# Patient Record
Sex: Female | Born: 1935 | Race: White | Hispanic: No | Smoking: Former smoker
Health system: Southern US, Community
[De-identification: ages and names within clinical notes are randomized; demographics above are authoritative.]

## PROBLEM LIST (undated history)

## (undated) DIAGNOSIS — E7800 Pure hypercholesterolemia, unspecified: Secondary | ICD-10-CM

## (undated) DIAGNOSIS — J449 Chronic obstructive pulmonary disease, unspecified: Secondary | ICD-10-CM

## (undated) DIAGNOSIS — K5790 Diverticulosis of intestine, part unspecified, without perforation or abscess without bleeding: Secondary | ICD-10-CM

## (undated) DIAGNOSIS — I10 Essential (primary) hypertension: Secondary | ICD-10-CM

## (undated) DIAGNOSIS — R739 Hyperglycemia, unspecified: Secondary | ICD-10-CM

## (undated) HISTORY — PX: TONSILLECTOMY: SUR1361

## (undated) HISTORY — DX: Diverticulosis of intestine, part unspecified, without perforation or abscess without bleeding: K57.90

## (undated) HISTORY — DX: Hyperglycemia, unspecified: R73.9

## (undated) HISTORY — DX: Essential (primary) hypertension: I10

## (undated) HISTORY — DX: Chronic obstructive pulmonary disease, unspecified: J44.9

## (undated) HISTORY — DX: Pure hypercholesterolemia, unspecified: E78.00

---

## 1976-03-30 HISTORY — PX: CHOLECYSTECTOMY: SHX55

## 1986-03-30 HISTORY — PX: WRIST SURGERY: SHX841

## 2004-08-13 ENCOUNTER — Ambulatory Visit: Payer: Self-pay | Admitting: Ophthalmology

## 2004-08-19 ENCOUNTER — Ambulatory Visit: Payer: Self-pay | Admitting: Ophthalmology

## 2004-12-17 ENCOUNTER — Ambulatory Visit: Payer: Self-pay | Admitting: Ophthalmology

## 2004-12-23 ENCOUNTER — Ambulatory Visit: Payer: Self-pay | Admitting: Ophthalmology

## 2005-09-16 ENCOUNTER — Ambulatory Visit: Payer: Self-pay | Admitting: Internal Medicine

## 2008-03-08 ENCOUNTER — Ambulatory Visit: Payer: Self-pay | Admitting: Internal Medicine

## 2008-04-26 ENCOUNTER — Ambulatory Visit: Payer: Self-pay | Admitting: Internal Medicine

## 2008-08-23 ENCOUNTER — Ambulatory Visit: Payer: Self-pay | Admitting: Internal Medicine

## 2011-03-21 ENCOUNTER — Inpatient Hospital Stay: Payer: Self-pay | Admitting: Internal Medicine

## 2011-03-30 ENCOUNTER — Encounter: Payer: Self-pay | Admitting: Internal Medicine

## 2011-03-31 ENCOUNTER — Encounter: Payer: Self-pay | Admitting: Internal Medicine

## 2011-05-01 ENCOUNTER — Encounter: Payer: Self-pay | Admitting: Internal Medicine

## 2011-05-29 ENCOUNTER — Encounter: Payer: Self-pay | Admitting: Internal Medicine

## 2012-02-18 ENCOUNTER — Encounter: Payer: Self-pay | Admitting: Internal Medicine

## 2012-02-18 ENCOUNTER — Ambulatory Visit (INDEPENDENT_AMBULATORY_CARE_PROVIDER_SITE_OTHER): Payer: Medicare Other | Admitting: Internal Medicine

## 2012-02-18 VITALS — BP 123/83 | HR 79 | Temp 97.7°F | Wt 124.8 lb

## 2012-02-18 DIAGNOSIS — R5383 Other fatigue: Secondary | ICD-10-CM

## 2012-02-18 DIAGNOSIS — J449 Chronic obstructive pulmonary disease, unspecified: Secondary | ICD-10-CM

## 2012-02-18 DIAGNOSIS — Z23 Encounter for immunization: Secondary | ICD-10-CM

## 2012-02-18 DIAGNOSIS — R739 Hyperglycemia, unspecified: Secondary | ICD-10-CM

## 2012-02-18 DIAGNOSIS — R5381 Other malaise: Secondary | ICD-10-CM

## 2012-02-18 DIAGNOSIS — E7800 Pure hypercholesterolemia, unspecified: Secondary | ICD-10-CM | POA: Insufficient documentation

## 2012-02-18 DIAGNOSIS — I10 Essential (primary) hypertension: Secondary | ICD-10-CM

## 2012-02-18 DIAGNOSIS — R7309 Other abnormal glucose: Secondary | ICD-10-CM

## 2012-02-18 LAB — POCT URINALYSIS DIPSTICK
Blood, UA: NEGATIVE
Nitrite, UA: NEGATIVE
Protein, UA: NEGATIVE
Urobilinogen, UA: 0.2
pH, UA: 5.5

## 2012-02-18 NOTE — Patient Instructions (Addendum)
It was nice to see you today.  I am glad you have been doing well.  Let me know if you need anything.

## 2012-02-19 LAB — CBC WITH DIFFERENTIAL/PLATELET
Basophils Absolute: 0.1 10*3/uL (ref 0.0–0.1)
Eosinophils Absolute: 0.1 10*3/uL (ref 0.0–0.7)
Lymphocytes Relative: 26 % (ref 12.0–46.0)
MCHC: 33.2 g/dL (ref 30.0–36.0)
Neutrophils Relative %: 63.6 % (ref 43.0–77.0)
RDW: 13.7 % (ref 11.5–14.6)

## 2012-02-22 ENCOUNTER — Other Ambulatory Visit (INDEPENDENT_AMBULATORY_CARE_PROVIDER_SITE_OTHER): Payer: Medicare Other | Admitting: *Deleted

## 2012-02-22 ENCOUNTER — Other Ambulatory Visit: Payer: Self-pay | Admitting: *Deleted

## 2012-02-22 DIAGNOSIS — R5383 Other fatigue: Secondary | ICD-10-CM

## 2012-02-22 DIAGNOSIS — R5381 Other malaise: Secondary | ICD-10-CM

## 2012-02-22 LAB — COMPREHENSIVE METABOLIC PANEL
ALT: 16 U/L (ref 0–35)
Alkaline Phosphatase: 84 U/L (ref 39–117)
Creatinine, Ser: 1.2 mg/dL (ref 0.4–1.2)
Glucose, Bld: 112 mg/dL — ABNORMAL HIGH (ref 70–99)
Sodium: 137 mEq/L (ref 135–145)
Total Bilirubin: 0.7 mg/dL (ref 0.3–1.2)
Total Protein: 7 g/dL (ref 6.0–8.3)

## 2012-02-24 NOTE — Progress Notes (Signed)
Called patient's daughter to let her know her mother's lab results

## 2012-02-24 NOTE — Progress Notes (Deleted)
Called patient's daughter to let her know reresultsre

## 2012-03-06 ENCOUNTER — Encounter: Payer: Self-pay | Admitting: Internal Medicine

## 2012-03-06 NOTE — Assessment & Plan Note (Signed)
Blood pressure is under good control.  Same medications.  Check metabolic panel.

## 2012-03-06 NOTE — Progress Notes (Signed)
  Subjective:    Patient ID: Christine Bowen, female    DOB: 12-04-1922, 78 y.o.   MRN: [id number]  HPI 78 year old female with past histroy of hypertension, emphysema/asthma and hypercholesterolemia who comes in today to follow up on these issues.  She is accompanied by her daughter.  History obtained from both of them.  She states she is doing well.  Saw Dr Meredeth Ide.  Breathing stable.  Continues oxygen.  Eating and drinking well.  No abdominal pain.  No nausea or vomiting.  Overall she feels things are stable.  Resides at [facility].    Past Medical History  Diagnosis Date  . COPD (chronic obstructive pulmonary disease)   . Hypertension   . Hypercholesterolemia   . Diverticulosis   . Hyperglycemia     Review of Systems Patient denies any headache, lightheadedness or dizziness.  No significant sinus or allergy symptoms.  No chest pain, tightness or palpitations.  No increased shortness of breath, cough or congestion.  She feels her breathing is stable.  No nausea or vomiting.  No abdominal pain or cramping.  No bowel change, such as diarrhea, constipation, BRBPR or melana.  No urine change.        Objective:   Physical Exam Filed Vitals:   02/18/12 1515  BP: 123/83  Pulse: 79  Temp: 97.7 F (71.2 C)   78 year old female in no acute distress.   HEENT:  Nares - clear.  OP- without lesions or erythema.  NECK:  Supple, nontender.   HEART:  Appears to be regular. LUNGS:  Without crackles or wheezing audible.  Respirations even and unlabored.   RADIAL PULSE:  Equal bilaterally.  ABDOMEN:  Soft, nontender.  No audible abdominal bruit.   EXTREMITIES:  No increased edema to be present.                   Assessment & Plan:  PREVIOUS WEIGHT LOSS.  Weight is stable.  No nausea or vomiting.  No abdominal pain.  Declines further GI testing or screening.  FATIGUE.  Will check cbc, met c and tsh.     HEALTH MAINTENANCE.  Declines mammograms and further colon evaluation or any further  testing. Declines bone density.

## 2012-03-06 NOTE — Assessment & Plan Note (Signed)
Low cholesterol diet.  Check lipid panel with next fasting labs.  

## 2012-03-06 NOTE — Assessment & Plan Note (Signed)
Low carb diet and exercise.  Follow met b and a1c.  

## 2012-03-06 NOTE — Assessment & Plan Note (Signed)
On oxygen.  Sees Dr Meredeth Ide.  Breathing stable.

## 2012-03-10 ENCOUNTER — Telehealth: Payer: Self-pay | Admitting: Internal Medicine

## 2012-03-10 NOTE — Telephone Encounter (Signed)
Patient Information:  Caller Name: Grasiela Jonsson  Phone: [phone]  Patient: Christine Bowen, Christine Bowen  Gender: Female  DOB: 1922/06/03  Age: 78 Years  PCP: Dale [place]  Office Follow Up:  Does the office need to follow up with this patient?: Yes  Instructions For The Office: Daughter would like to bring by lab specimen  RN Note:  Daughter would like to bring u/a specimen to the office. Her mother is in assisted living. Please contact daughter Cell [phone] or home  [phone]  Symptoms  Reason For Call & Symptoms: Daughter states her mother is at [facility]. She noticed yesterday while visiting her she was having Urinary Frequency and an odor to her Urine. Onset yesterday.  Her mother did complain of being tired/weak.  She states her mother is oxygen .  Daughter would like to bring a Urine Sample by the office., instead of getting her out again today. Please contact.  Reviewed Health History In EMR: Yes  Reviewed Medications In EMR: Yes  Reviewed Allergies In EMR: Yes  Reviewed Surgeries / Procedures: No  Date of Onset of Symptoms: 03/10/2012  Guideline(s) Used:  Urination Pain - Female  Disposition Per Guideline:   See Today in Office  Reason For Disposition Reached:   Age > 50 years  Advice Given:  Fluids:   Drink extra fluids. Drink 8-10 glasses of liquids a day (Reason: to produce a dilute, non-irritating urine).  Cranberry Juice:   Some people think that drinking cranberry juice may help in fighting urinary tract infections. However, there is no good research that has ever proved this.  Dosage Cranberry Juice Cocktail: 8 oz (240 ml) twice a day.  Patient Refused Recommendation:  Patient Refused Care Advice  Daughter would like to bring lab specimen by first  Medication allergies and history reviewed in EMR

## 2012-03-10 NOTE — Telephone Encounter (Signed)
Would it be all right to bring urine by office. Please advise

## 2012-03-10 NOTE — Telephone Encounter (Signed)
Daughter returned call, she is going to stop by and pick-up urine cup from office tomorrow.

## 2012-03-10 NOTE — Telephone Encounter (Signed)
Ok for daughter to bring urine by.

## 2012-03-11 ENCOUNTER — Telehealth: Payer: Self-pay | Admitting: *Deleted

## 2012-03-11 ENCOUNTER — Other Ambulatory Visit (INDEPENDENT_AMBULATORY_CARE_PROVIDER_SITE_OTHER): Payer: Medicare Other

## 2012-03-11 DIAGNOSIS — N390 Urinary tract infection, site not specified: Secondary | ICD-10-CM

## 2012-03-11 LAB — POCT URINALYSIS DIPSTICK
Bilirubin, UA: NEGATIVE
Glucose, UA: NEGATIVE
Ketones, UA: NEGATIVE
Leukocytes, UA: NEGATIVE
pH, UA: 5.5

## 2012-03-11 NOTE — Telephone Encounter (Signed)
Urine culture sent.  Hold on abx

## 2012-03-11 NOTE — Telephone Encounter (Signed)
Do you want a urine culture for this pt? Thank you

## 2012-03-11 NOTE — Telephone Encounter (Signed)
Called patient with results. Will do further testing on urine.

## 2012-03-11 NOTE — Addendum Note (Signed)
Addended by: Montine Circle D on: 03/11/2012 05:02 PM   Modules accepted: Orders

## 2012-03-13 LAB — URINE CULTURE

## 2012-05-20 ENCOUNTER — Telehealth: Payer: Self-pay | Admitting: Internal Medicine

## 2012-05-20 NOTE — Telephone Encounter (Signed)
If pt has been vomiting for 6 days - needs eval today.  Probably needs eval in er if 6 days - concern regarding her age and dehydration

## 2012-05-20 NOTE — Telephone Encounter (Signed)
Patient Information:  Caller Name: Bonnie/Daughter  Phone: [phone]  Patient: Christine Bowen, Christine Bowen  Gender: Female  DOB: [birth year]  Age: 79 Years  PCP: Dale [facility]  Office Follow Up:  Does the office need to follow up with this patient?: Yes  Instructions For The Office: Assisted Living Patient. No appt available . Vomiting x6 days. TRIAGES TO BE SEEN TODAY   Symptoms  Reason For Call & Symptoms: Christine Bowen Daughter is calling concerning her mother Christine Bowen.  She is a resident at [facility] living.  She got the "GI bug" onset  05/15/12. Vomiting has contined for x6 days.  Daughter believes 1-2 x daily.  > vomiting greater 6 days.  Daughter has not seen her in a few days only talked to her.  Unable to complete a detailed triage. Daughter is going today to check on her.    Reviewed Health History In EMR: Yes  Reviewed Medications In EMR: Yes  Reviewed Allergies In EMR: Yes  Reviewed Surgeries / Procedures: No  Date of Onset of Symptoms: 05/15/2012  Treatments Tried: oral fluids  Treatments Tried Worked: No  Guideline(s) Used:  Vomiting  Disposition Per Guideline:   See Today in Office  Reason For Disposition Reached:   Vomiting lasts > 48 hours  Advice Given:  Clear Liquids:  Sip water or a rehydration drink (e.g., Gatorade or Powerade).  Other options: 1/2 strength flat lemon-lime soda or ginger ale.  After 4 hours without vomiting, increase the amount.  Reassurance:  Vomiting can be caused by many types of illnesses. It can be caused by a stomach flu virus. It can be caused by eating or drinking something that disagreed with your stomach.  Adults with vomiting need to stay hydrated. This is the most important thing. If you don't drink and replace lost fluids, you may get dehydrated.  Call Back If:  Vomiting lasts for more than 2 days (48 hours)  Signs of dehydration occur  You become worse.

## 2012-05-20 NOTE — Telephone Encounter (Signed)
Called patient's daughter and advised daughter to take patient to acute care for eval. Patient's daughter stated that patient is reluctant to go but that she would try to persuade her to go. Advised daughter to follow with [place] after.

## 2012-06-21 ENCOUNTER — Emergency Department: Payer: Self-pay | Admitting: Emergency Medicine

## 2012-06-23 ENCOUNTER — Telehealth: Payer: Self-pay | Admitting: Internal Medicine

## 2012-06-23 NOTE — Telephone Encounter (Signed)
Please advise 

## 2012-06-23 NOTE — Telephone Encounter (Signed)
Ok for home health

## 2012-06-23 NOTE — Telephone Encounter (Signed)
States when changing dressing for laceration on pt lower left leg the wrap is pulling away the skin.  Asking for order for home health to come in and do the dressing changes.

## 2012-06-24 NOTE — Telephone Encounter (Signed)
Patient is staying at [facility]. #[phone] fax [phone]. Patient needing something done today . Daughter  Doylene Canard came by the office

## 2012-06-24 NOTE — Telephone Encounter (Signed)
See other message

## 2012-06-24 NOTE — Telephone Encounter (Signed)
Amber which home health is this patient with? Thanks.

## 2012-06-26 ENCOUNTER — Emergency Department: Payer: Self-pay | Admitting: Internal Medicine

## 2012-06-26 LAB — COMPREHENSIVE METABOLIC PANEL
Albumin: 3.3 g/dL — ABNORMAL LOW (ref 3.4–5.0)
Alkaline Phosphatase: 102 U/L (ref 50–136)
BUN: 20 mg/dL — ABNORMAL HIGH (ref 7–18)
Bilirubin,Total: 0.8 mg/dL (ref 0.2–1.0)
Chloride: 97 mmol/L — ABNORMAL LOW (ref 98–107)
Glucose: 115 mg/dL — ABNORMAL HIGH (ref 65–99)
Potassium: 3 mmol/L — ABNORMAL LOW (ref 3.5–5.1)
SGOT(AST): 25 U/L (ref 15–37)
SGPT (ALT): 18 U/L (ref 12–78)

## 2012-06-26 LAB — CBC
HGB: 13.9 g/dL (ref 12.0–16.0)
MCH: 30.1 pg (ref 26.0–34.0)
MCHC: 35 g/dL (ref 32.0–36.0)
MCV: 86 fL (ref 80–100)
RBC: 4.61 10*6/uL (ref 3.80–5.20)
WBC: 7 10*3/uL (ref 3.6–11.0)

## 2012-06-27 ENCOUNTER — Telehealth: Payer: Self-pay | Admitting: *Deleted

## 2012-06-27 NOTE — Telephone Encounter (Signed)
Called Kendal Hymen back to make appointment for patient to see Raquel Rey. Kendal Hymen said that she does not want to bring mother back out today. She would rather wait until Friday and come in to get you to remove stitches from patient's leg. ER physician advised patient to see you to remove her stitches. Kendal Hymen said that she wants to come in the afternoon because patient takes oxygen treatments in the morning.

## 2012-06-29 ENCOUNTER — Telehealth: Payer: Self-pay | Admitting: Internal Medicine

## 2012-06-29 NOTE — Telephone Encounter (Signed)
Patient's daughter calling to see when she can bring her mother in to see Dr. Lorin Picket for her ER visit and to have her stiches removed.

## 2012-06-29 NOTE — Telephone Encounter (Signed)
[facility] did receive an order for a nurse to perform physical and occupational therapy for this patient ,but there will be a delay in the start up of her care because of staff shortage. They should be able to get in over the weekend. Christine Bowen will notify [employer] .

## 2012-06-30 NOTE — Telephone Encounter (Signed)
Have her come in tomorrow at 3:30.

## 2012-06-30 NOTE — Telephone Encounter (Signed)
I was under the impression that someone had already assessed her.  I need to know how her let looks.  Have they changed dressing at all

## 2012-06-30 NOTE — Telephone Encounter (Signed)
Called patient's daughter back with appointment time.

## 2012-06-30 NOTE — Telephone Encounter (Signed)
Have her come in tomorrow at 3:30 (block 30 min).

## 2012-06-30 NOTE — Telephone Encounter (Signed)
The dressing is being changed. The wound looks much better, still wide open wound with partial stiches. The wound bleed a little when dressing was changed yesterday. Over all the wound looks much better and has signs of healing.

## 2012-06-30 NOTE — Telephone Encounter (Signed)
Please advise 

## 2012-06-30 NOTE — Telephone Encounter (Signed)
See other message

## 2012-07-01 ENCOUNTER — Ambulatory Visit (INDEPENDENT_AMBULATORY_CARE_PROVIDER_SITE_OTHER): Payer: Medicare Other | Admitting: Internal Medicine

## 2012-07-01 ENCOUNTER — Encounter: Payer: Self-pay | Admitting: Internal Medicine

## 2012-07-01 VITALS — BP 102/60 | HR 93 | Temp 97.5°F | Ht 60.0 in | Wt 121.5 lb

## 2012-07-01 DIAGNOSIS — I10 Essential (primary) hypertension: Secondary | ICD-10-CM

## 2012-07-01 DIAGNOSIS — J449 Chronic obstructive pulmonary disease, unspecified: Secondary | ICD-10-CM

## 2012-07-03 ENCOUNTER — Encounter: Payer: Self-pay | Admitting: Internal Medicine

## 2012-07-03 NOTE — Assessment & Plan Note (Signed)
Blood pressure is under good control.  Same medications.

## 2012-07-03 NOTE — Assessment & Plan Note (Signed)
On oxygen.  Sees Dr Meredeth Ide.  With increased cough and congestion.  Better.  Antibiotics due to end today.  Will extend the avelox one more week.  Use the inhalers.   Follow.
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
## 2012-07-04 ENCOUNTER — Telehealth: Payer: Self-pay | Admitting: *Deleted

## 2012-07-04 NOTE — Telephone Encounter (Signed)
I called home health about patient's dressing changes. The nurse said that they have not changed dressing today but she was going over there in the morning to change dressing, I asked nurse to call us tomorrow to let us know how patient's wound looks. Nurses name is Britt Boozer and her number is [phone].

## 2012-07-04 NOTE — Telephone Encounter (Signed)
Noted.  They also need to let us know if she needs to come over here to get sutures removed.

## 2012-07-05 NOTE — Telephone Encounter (Signed)
Christine Bowen please schedule this patient for 12:00 pm 07/05/12. Thanks.

## 2012-07-05 NOTE — Telephone Encounter (Signed)
Called home health back and they would not give me any information about patient's dressing change or how the wound looks. Nurse at home health was talking to patient's daughter at the home and they want to go ahead and bring patient in at 12:00 tomorrow.

## 2012-07-05 NOTE — Telephone Encounter (Signed)
Patient's daughter called back to let us know that she does not need to come in tomorrow. Patient's stitches have been remove and home health said wound looks good. They will continue to change dressing two times a weekly.

## 2012-07-05 NOTE — Telephone Encounter (Signed)
noted 

## 2012-07-20 DIAGNOSIS — S81809A Unspecified open wound, unspecified lower leg, initial encounter: Secondary | ICD-10-CM

## 2012-07-20 DIAGNOSIS — J069 Acute upper respiratory infection, unspecified: Secondary | ICD-10-CM

## 2012-07-20 DIAGNOSIS — S91009A Unspecified open wound, unspecified ankle, initial encounter: Secondary | ICD-10-CM

## 2012-07-20 DIAGNOSIS — S81009A Unspecified open wound, unspecified knee, initial encounter: Secondary | ICD-10-CM

## 2012-07-20 DIAGNOSIS — R279 Unspecified lack of coordination: Secondary | ICD-10-CM

## 2012-07-28 ENCOUNTER — Telehealth: Payer: Self-pay

## 2012-07-28 NOTE — Telephone Encounter (Signed)
Noted.  Just have them let us know if they need anything from [place].

## 2012-07-28 NOTE — Telephone Encounter (Signed)
Jasmine December from [facility] left message on voicemail about patient Christine Bowen, regarding her wound. Jasmine December stated that the wound is looking good and she is going to check on the patient next Monday to see how the wound is looking. Jasmine December stated that if the wound looked good she would discharge the patient.

## 2012-08-17 ENCOUNTER — Encounter: Payer: Self-pay | Admitting: Internal Medicine

## 2012-08-17 ENCOUNTER — Ambulatory Visit (INDEPENDENT_AMBULATORY_CARE_PROVIDER_SITE_OTHER): Payer: Medicare Other | Admitting: Internal Medicine

## 2012-08-17 VITALS — BP 130/70 | HR 89 | Temp 98.6°F | Ht 60.0 in | Wt 127.5 lb

## 2012-08-17 DIAGNOSIS — E7800 Pure hypercholesterolemia, unspecified: Secondary | ICD-10-CM

## 2012-08-17 DIAGNOSIS — J4489 Other specified chronic obstructive pulmonary disease: Secondary | ICD-10-CM

## 2012-08-17 DIAGNOSIS — R5383 Other fatigue: Secondary | ICD-10-CM

## 2012-08-17 DIAGNOSIS — R5381 Other malaise: Secondary | ICD-10-CM

## 2012-08-17 DIAGNOSIS — R739 Hyperglycemia, unspecified: Secondary | ICD-10-CM

## 2012-08-17 DIAGNOSIS — R7309 Other abnormal glucose: Secondary | ICD-10-CM

## 2012-08-17 DIAGNOSIS — I10 Essential (primary) hypertension: Secondary | ICD-10-CM

## 2012-08-17 DIAGNOSIS — J449 Chronic obstructive pulmonary disease, unspecified: Secondary | ICD-10-CM

## 2012-08-18 ENCOUNTER — Ambulatory Visit: Payer: Medicare Other | Admitting: Internal Medicine

## 2012-08-18 LAB — COMPREHENSIVE METABOLIC PANEL
ALT: 15 U/L (ref 0–35)
AST: 23 U/L (ref 0–37)
Alkaline Phosphatase: 75 U/L (ref 39–117)
BUN: 17 mg/dL (ref 6–23)
Calcium: 9.4 mg/dL (ref 8.4–10.5)
Chloride: 101 mEq/L (ref 96–112)
Creatinine, Ser: 1.1 mg/dL (ref 0.4–1.2)
Total Bilirubin: 0.6 mg/dL (ref 0.3–1.2)

## 2012-08-18 LAB — CBC WITH DIFFERENTIAL/PLATELET
Basophils Absolute: 0 10*3/uL (ref 0.0–0.1)
Basophils Relative: 0.4 % (ref 0.0–3.0)
Eosinophils Absolute: 0.1 10*3/uL (ref 0.0–0.7)
HCT: 41.1 % (ref 36.0–46.0)
Hemoglobin: 14 g/dL (ref 12.0–15.0)
Lymphs Abs: 2.6 10*3/uL (ref 0.7–4.0)
MCHC: 34.1 g/dL (ref 30.0–36.0)
MCV: 88 fl (ref 78.0–100.0)
Neutro Abs: 7.8 10*3/uL — ABNORMAL HIGH (ref 1.4–7.7)
RBC: 4.67 Mil/uL (ref 3.87–5.11)
RDW: 13.7 % (ref 11.5–14.6)

## 2012-08-18 LAB — TSH: TSH: 1.4 u[IU]/mL (ref 0.35–5.50)

## 2012-08-19 ENCOUNTER — Other Ambulatory Visit: Payer: Self-pay | Admitting: Internal Medicine

## 2012-08-19 DIAGNOSIS — D72829 Elevated white blood cell count, unspecified: Secondary | ICD-10-CM

## 2012-08-19 NOTE — Progress Notes (Signed)
F/u labs ordered.   

## 2012-08-21 ENCOUNTER — Encounter: Payer: Self-pay | Admitting: Internal Medicine

## 2012-08-21 NOTE — Progress Notes (Signed)
  Subjective:    Patient ID: Christine Bowen, female    DOB: 21-May-1922, 79 y.o.   MRN: [id number]  HPI 79 year old female with past histroy of hypertension, emphysema/asthma and hypercholesterolemia who comes in today for a scheduled follow up.   She is accompanied by her daughter.  History obtained from both of them.  Her cough and congestion have improved.  She feels her breathing is overall stable.  Using her inhalers.  Seeing Dr Meredeth Ide.  Due to see him next week.  Still gets winded with increased activity or exertion, but feels this is stable.  No chest pain or tightness.  No acid reflux.  Eating and drinking well.  No nausea or vomiting.  No bowel change.      Past Medical History  Diagnosis Date  . COPD (chronic obstructive pulmonary disease)   . Hypertension   . Hypercholesterolemia   . Diverticulosis   . Hyperglycemia     Current Outpatient Prescriptions on File Prior to Visit  Medication Sig Dispense Refill  . aspirin 325 MG tablet Take 325 mg by mouth daily.      . meclizine (ANTIVERT) 25 MG tablet Take 25 mg by mouth 2 (two) times daily as needed.      . sertraline (ZOLOFT) 25 MG tablet Take 25 mg by mouth daily.      . Skin Protectants, Misc. (MINERIN) CREA Apply topically daily.      Marland Kitchen tiotropium (SPIRIVA) 18 MCG inhalation capsule Place 18 mcg into inhaler and inhale daily.      Marland Kitchen zolpidem (AMBIEN) 5 MG tablet 5 mg. 1/2 tablet qhs prn       No current facility-administered medications on file prior to visit.    Review of Systems Patient denies any headache, lightheadedness or dizziness.  No significant sinus or allergy symptoms.  No chest pain, tightness or palpitations.  Increased cough and chest congestion.  She feels her breathing is stable.  No significant increased sob.  No nausea or vomiting.  No abdominal pain or cramping.  No bowel change, such as diarrhea, constipation, BRBPR or melana.  No urine change.   Leg laceration - better.  Healed.  Does not walk around a  lot.       Objective:   Physical Exam  Filed Vitals:   08/17/12 1443  BP: 130/70  Pulse: 89  Temp: 98.6 F (37 C)   Blood pressure recheck;  3/37  79 year old female in no acute distress.   HEENT:  Nares - clear.  OP- without lesions or erythema.  NECK:  Supple, nontender.   HEART:  Appears to be regular. LUNGS:  Without crackles or wheezing audible.  Respirations even and unlabored.  Good breath sounds bilaterally.  Some increased cough with expiration.  RADIAL PULSE:  Equal bilaterally.  ABDOMEN:  Soft, nontender.     EXTREMITIES:  No increased edema to be present.  Stable.  Laceration healed.  No increased erythema.                   Assessment & Plan:  LEG LACERATION.  Healed.  Follow.    URI.  Better. Cough and congestion improved.  Breathing stable.  Follow.       HEALTH MAINTENANCE.  Declines mammograms and further colon evaluation or any further testing. Declines bone density.

## 2012-08-21 NOTE — Assessment & Plan Note (Signed)
On oxygen.  Sees Dr Meredeth Ide.  Cough and congestion better.  Feels her breathing (overall) is stable.  Follow.

## 2012-08-21 NOTE — Assessment & Plan Note (Signed)
Blood pressure is under good control.  Same medications.  Check metabolic panel.

## 2012-08-21 NOTE — Assessment & Plan Note (Signed)
Low cholesterol diet.  Check lipid panel with next fasting labs.  

## 2012-08-21 NOTE — Assessment & Plan Note (Signed)
Low carb diet and exercise.  Follow met b and a1c.  

## 2012-09-02 ENCOUNTER — Other Ambulatory Visit: Payer: Medicare Other

## 2012-09-06 ENCOUNTER — Other Ambulatory Visit (INDEPENDENT_AMBULATORY_CARE_PROVIDER_SITE_OTHER): Payer: Medicare Other

## 2012-09-06 ENCOUNTER — Encounter: Payer: Self-pay | Admitting: Internal Medicine

## 2012-09-06 DIAGNOSIS — D72829 Elevated white blood cell count, unspecified: Secondary | ICD-10-CM

## 2012-09-06 LAB — CBC WITH DIFFERENTIAL/PLATELET
Basophils Absolute: 0.1 10*3/uL (ref 0.0–0.1)
Basophils Relative: 0.5 % (ref 0.0–3.0)
Eosinophils Absolute: 0.1 10*3/uL (ref 0.0–0.7)
Lymphocytes Relative: 27.4 % (ref 12.0–46.0)
MCHC: 33.3 g/dL (ref 30.0–36.0)
MCV: 90.2 fl (ref 78.0–100.0)
Monocytes Absolute: 0.8 10*3/uL (ref 0.1–1.0)
Neutro Abs: 6 10*3/uL (ref 1.4–7.7)
Neutrophils Relative %: 62.5 % (ref 43.0–77.0)
RBC: 4.57 Mil/uL (ref 3.87–5.11)
RDW: 13.5 % (ref 11.5–14.6)

## 2012-10-24 ENCOUNTER — Ambulatory Visit: Payer: Self-pay | Admitting: Cardiology

## 2012-10-24 DIAGNOSIS — J4489 Other specified chronic obstructive pulmonary disease: Secondary | ICD-10-CM

## 2012-10-24 DIAGNOSIS — J449 Chronic obstructive pulmonary disease, unspecified: Secondary | ICD-10-CM

## 2012-10-24 LAB — CBC WITH DIFFERENTIAL/PLATELET
Basophil #: 0.1 10*3/uL (ref 0.0–0.1)
Basophil %: 1.1 %
HCT: 42.7 % (ref 35.0–47.0)
HGB: 14.6 g/dL (ref 12.0–16.0)
Lymphocyte %: 26 %
MCH: 29.3 pg (ref 26.0–34.0)
MCHC: 34.2 g/dL (ref 32.0–36.0)
MCV: 86 fL (ref 80–100)
Monocyte #: 0.8 x10 3/mm (ref 0.2–0.9)
Neutrophil %: 64 %
Platelet: 302 10*3/uL (ref 150–440)
RBC: 4.98 10*6/uL (ref 3.80–5.20)
RDW: 13.5 % (ref 11.5–14.5)

## 2012-10-24 LAB — APTT: Activated PTT: 31.5 secs (ref 23.6–35.9)

## 2012-10-24 LAB — BASIC METABOLIC PANEL
Anion Gap: 6 — ABNORMAL LOW (ref 7–16)
BUN: 20 mg/dL — ABNORMAL HIGH (ref 7–18)
Calcium, Total: 9.9 mg/dL (ref 8.5–10.1)
Chloride: 101 mmol/L (ref 98–107)
Co2: 32 mmol/L (ref 21–32)
Glucose: 96 mg/dL (ref 65–99)

## 2012-10-24 LAB — PROTIME-INR: INR: 1

## 2012-10-27 ENCOUNTER — Ambulatory Visit: Payer: Self-pay | Admitting: Cardiology

## 2012-10-27 ENCOUNTER — Encounter: Payer: Self-pay | Admitting: Cardiology

## 2013-01-13 ENCOUNTER — Other Ambulatory Visit: Payer: Self-pay | Admitting: *Deleted

## 2013-01-13 MED ORDER — ZOLPIDEM TARTRATE 5 MG PO TABS: ORAL_TABLET | ORAL | Status: DC

## 2013-01-13 NOTE — Telephone Encounter (Signed)
Refilled ambien #30 with one refill.   

## 2013-01-13 NOTE — Telephone Encounter (Signed)
Requesting a new Rx for Ambien (same directions) fax Rx to : [phone] & they can send it to there pharmacy

## 2013-01-27 ENCOUNTER — Telehealth: Payer: Self-pay | Admitting: *Deleted

## 2013-01-27 NOTE — Telephone Encounter (Signed)
Need permission to D/C a treatment & a medication- Minerin Cream & the order for the warm compress  Fax order to: [phone]

## 2013-01-29 NOTE — Telephone Encounter (Signed)
Ordered added to another order they had faxed.  In your box.  Ok for d/c if no longer needed.

## 2013-01-30 NOTE — Telephone Encounter (Signed)
Forms faxed

## 2013-02-17 ENCOUNTER — Telehealth: Payer: Self-pay | Admitting: Internal Medicine

## 2013-02-17 NOTE — Telephone Encounter (Signed)
I am ok to send order for urinalysis and culture, but are they able to do this over at [facility].  I was not aware they were able to do this.  If symptoms and unable to do, then I would rec her being evaluated at Acute Care.

## 2013-02-17 NOTE — Telephone Encounter (Signed)
Pt's daughter is calling and saying pt is coming down with uti and is needing order sent over to [facility] assisted living.Fax number is [phone]

## 2013-02-17 NOTE — Telephone Encounter (Signed)
Order written.  Gave to [facility] to fax.

## 2013-02-17 NOTE — Telephone Encounter (Signed)
I spoke with [facility], they said all they need is a order. The fax number below is correct

## 2013-02-20 ENCOUNTER — Ambulatory Visit (INDEPENDENT_AMBULATORY_CARE_PROVIDER_SITE_OTHER): Payer: Medicare Other | Admitting: Internal Medicine

## 2013-02-20 ENCOUNTER — Encounter: Payer: Self-pay | Admitting: Internal Medicine

## 2013-02-20 VITALS — BP 120/70 | HR 86 | Temp 98.7°F | Ht 61.0 in | Wt 127.5 lb

## 2013-02-20 DIAGNOSIS — Z1239 Encounter for other screening for malignant neoplasm of breast: Secondary | ICD-10-CM

## 2013-02-20 DIAGNOSIS — E7800 Pure hypercholesterolemia, unspecified: Secondary | ICD-10-CM

## 2013-02-20 DIAGNOSIS — R739 Hyperglycemia, unspecified: Secondary | ICD-10-CM

## 2013-02-20 DIAGNOSIS — I10 Essential (primary) hypertension: Secondary | ICD-10-CM

## 2013-02-20 DIAGNOSIS — I495 Sick sinus syndrome: Secondary | ICD-10-CM

## 2013-02-20 DIAGNOSIS — J449 Chronic obstructive pulmonary disease, unspecified: Secondary | ICD-10-CM

## 2013-02-20 DIAGNOSIS — R350 Frequency of micturition: Secondary | ICD-10-CM

## 2013-02-20 DIAGNOSIS — R7309 Other abnormal glucose: Secondary | ICD-10-CM

## 2013-02-20 NOTE — Progress Notes (Signed)
Subjective:    Patient ID: Christine Bowen, female    DOB: [birth year], 79 y.o.   MRN: [id number]  HPI 79 year old female with past histroy of hypertension, emphysema/asthma and hypercholesterolemia who comes in today for a scheduled follow up.   She is accompanied by her daughter.  History obtained from both of them.  She feels her breathing is overall stable.  Using her inhalers.  Seeing Dr Meredeth Ide.  No chest pain or tightness.  Has done well s/p pacemaker placement.  Had placed 10/26/12.  Still with soreness around the pacemaker site.  Sees Dr Lady Gary. No acid reflux.  Eating and drinking well.  No nausea or vomiting.  No bowel change.  Does report some increased urinary pressure.  Started several days ago.  Some increased frequency.  No hematuria.      Past Medical History  Diagnosis Date  . COPD (chronic obstructive pulmonary disease)   . Hypertension   . Hypercholesterolemia   . Diverticulosis   . Hyperglycemia     Current Outpatient Prescriptions on File Prior to Visit  Medication Sig Dispense Refill  . aspirin 325 MG tablet Take 325 mg by mouth daily.      . Fluticasone-Salmeterol (ADVAIR DISKUS) 250-50 MCG/DOSE AEPB Inhale 1 puff into the lungs every 12 (twelve) hours.      . hydrochlorothiazide (MICROZIDE) 12.5 MG capsule Take 12.5 mg by mouth daily.      . meclizine (ANTIVERT) 25 MG tablet Take 25 mg by mouth 2 (two) times daily as needed.      . sertraline (ZOLOFT) 25 MG tablet Take 25 mg by mouth daily.      . Skin Protectants, Misc. (MINERIN) CREA Apply topically daily.      Marland Kitchen tiotropium (SPIRIVA) 18 MCG inhalation capsule Place 18 mcg into inhaler and inhale daily.      Marland Kitchen tobramycin (TOBREX) 0.3 % ophthalmic ointment Place 1 application into the right eye daily.      Marland Kitchen zolpidem (AMBIEN) 5 MG tablet 1/2 tablet qhs prn  30 tablet  0   No current facility-administered medications on file prior to visit.    Review of Systems Patient denies any headache, lightheadedness or  dizziness.  No significant sinus or allergy symptoms.  No chest pain, tightness or palpitations.  No increased cough or chest congestion.  She feels her breathing is stable.  No significant increased sob.  No nausea or vomiting.  No abdominal pain or cramping.  No bowel change, such as diarrhea, constipation, BRBPR or melana.  Urine change as outlined.  Has done well since her pacemaker placement.  Still some soreness around the pacemaker site.        Objective:   Physical Exam  Filed Vitals:   02/20/13 1542  BP: 120/70  Pulse: 86  Temp: 98.7 F (37.1 C)   Blood pressure recheck;  136/74, pulse 47  79 year old female in no acute distress.   HEENT:  Nares - clear.  OP- without lesions or erythema.  NECK:  Supple, nontender.   HEART:  Appears to be regular. LUNGS:  Without crackles or wheezing audible.  Respirations even and unlabored.  Good breath sounds bilaterally. CHEST WALL:  Increased pain to palpation around the pacemaker site.   RADIAL PULSE:  Equal bilaterally.  ABDOMEN:  Soft, nontender.     EXTREMITIES:  No increased edema to be present.  Stable.  No increased erythema.  Stasis changes.  Stable.  Assessment & Plan:  HEALTH MAINTENANCE.  Had declined mammograms and further colon evaluation or any further testing.  Declines bone density.  Was agreeable today for mammogram.

## 2013-02-20 NOTE — Progress Notes (Signed)
Pre-visit discussion using our clinic review tool. No additional management support is needed unless otherwise documented below in the visit note.  

## 2013-02-21 LAB — URINALYSIS, ROUTINE W REFLEX MICROSCOPIC
Bilirubin Urine: NEGATIVE
Ketones, ur: NEGATIVE
RBC / HPF: NONE SEEN (ref 0–?)
Specific Gravity, Urine: 1.025 (ref 1.000–1.030)
Total Protein, Urine: NEGATIVE
Urine Glucose: NEGATIVE
pH: 6.5 (ref 5.0–8.0)

## 2013-02-21 LAB — COMPREHENSIVE METABOLIC PANEL
ALT: 16 U/L (ref 0–35)
AST: 21 U/L (ref 0–37)
BUN: 16 mg/dL (ref 6–23)
Calcium: 9.6 mg/dL (ref 8.4–10.5)
Creatinine, Ser: 1 mg/dL (ref 0.4–1.2)
Total Bilirubin: 0.6 mg/dL (ref 0.3–1.2)

## 2013-02-22 LAB — CULTURE, URINE COMPREHENSIVE

## 2013-02-24 ENCOUNTER — Other Ambulatory Visit: Payer: Self-pay | Admitting: *Deleted

## 2013-02-24 MED ORDER — CEFUROXIME AXETIL 250 MG PO TABS: 250.0000 mg | ORAL_TABLET | Freq: Two times a day (BID) | ORAL | Status: DC

## 2013-02-25 ENCOUNTER — Encounter: Payer: Self-pay | Admitting: Internal Medicine

## 2013-02-25 DIAGNOSIS — I495 Sick sinus syndrome: Secondary | ICD-10-CM | POA: Insufficient documentation

## 2013-02-25 DIAGNOSIS — R112 Nausea with vomiting, unspecified: Secondary | ICD-10-CM | POA: Insufficient documentation

## 2013-02-25 NOTE — Assessment & Plan Note (Signed)
Was previously having increased fatigue.  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  Holter also revealed pauses.  Is now s/p pacemaker placement.  Has done well.  Follow.  Continue to follow up with Dr Fath.       

## 2013-02-25 NOTE — Assessment & Plan Note (Signed)
Low carb diet and exercise.  Follow met b and a1c.  

## 2013-02-25 NOTE — Assessment & Plan Note (Signed)
Check urinalysis and culture.  Treat if infection present.  Follow.

## 2013-02-25 NOTE — Assessment & Plan Note (Signed)
Low cholesterol diet.  Check lipid panel with next fasting labs.  

## 2013-02-25 NOTE — Assessment & Plan Note (Signed)
On oxygen.  Sees Dr Fleming.  Uses inhalers regularly.  Breathing stable.  Follow.   

## 2013-02-25 NOTE — Assessment & Plan Note (Signed)
Blood pressure is under good control.  Same medications.  Follow metabolic panel.   

## 2013-04-21 ENCOUNTER — Telehealth: Payer: Self-pay | Admitting: *Deleted

## 2013-04-21 NOTE — Telephone Encounter (Signed)
Camille with [facility] called to report that Renea Eevelyn had a incident with her daughter. The leg peg of the wheelchair hit her leg & she has a bad skin tear. She is asking for a order to have home helath with nursing to treat wound care be faxed to [phone]. She has faxed over an order at 11:48am for us to complete & faxed back. Order was completed & faxed back to [facility] at 12:10pm.

## 2013-05-01 DIAGNOSIS — J449 Chronic obstructive pulmonary disease, unspecified: Secondary | ICD-10-CM

## 2013-05-01 DIAGNOSIS — L97809 Non-pressure chronic ulcer of other part of unspecified lower leg with unspecified severity: Secondary | ICD-10-CM

## 2013-05-01 DIAGNOSIS — I4891 Unspecified atrial fibrillation: Secondary | ICD-10-CM

## 2013-05-01 DIAGNOSIS — I10 Essential (primary) hypertension: Secondary | ICD-10-CM

## 2013-06-14 DIAGNOSIS — Z0279 Encounter for issue of other medical certificate: Secondary | ICD-10-CM

## 2013-06-16 ENCOUNTER — Encounter: Payer: Self-pay | Admitting: Internal Medicine

## 2013-06-16 ENCOUNTER — Ambulatory Visit (INDEPENDENT_AMBULATORY_CARE_PROVIDER_SITE_OTHER): Payer: Commercial Managed Care - HMO | Admitting: Internal Medicine

## 2013-06-16 ENCOUNTER — Encounter (INDEPENDENT_AMBULATORY_CARE_PROVIDER_SITE_OTHER): Payer: Self-pay

## 2013-06-16 VITALS — BP 130/80 | HR 85 | Temp 98.3°F | Ht 61.0 in | Wt 127.8 lb

## 2013-06-16 DIAGNOSIS — J449 Chronic obstructive pulmonary disease, unspecified: Secondary | ICD-10-CM

## 2013-06-16 DIAGNOSIS — R5381 Other malaise: Secondary | ICD-10-CM

## 2013-06-16 DIAGNOSIS — I10 Essential (primary) hypertension: Secondary | ICD-10-CM

## 2013-06-16 DIAGNOSIS — R5383 Other fatigue: Secondary | ICD-10-CM

## 2013-06-16 DIAGNOSIS — I495 Sick sinus syndrome: Secondary | ICD-10-CM

## 2013-06-16 DIAGNOSIS — E7800 Pure hypercholesterolemia, unspecified: Secondary | ICD-10-CM

## 2013-06-16 DIAGNOSIS — R739 Hyperglycemia, unspecified: Secondary | ICD-10-CM

## 2013-06-16 DIAGNOSIS — R7309 Other abnormal glucose: Secondary | ICD-10-CM

## 2013-06-16 NOTE — Progress Notes (Signed)
Pre-visit discussion using our clinic review tool. No additional management support is needed unless otherwise documented below in the visit note.  

## 2013-06-18 ENCOUNTER — Encounter: Payer: Self-pay | Admitting: Internal Medicine

## 2013-06-18 DIAGNOSIS — R5383 Other fatigue: Secondary | ICD-10-CM | POA: Insufficient documentation

## 2013-06-18 NOTE — Assessment & Plan Note (Signed)
Doing some better now.  Will check cbc, met c and tsh.  Follow.  Stay hydrated.

## 2013-06-18 NOTE — Assessment & Plan Note (Signed)
Was previously having increased fatigue.  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  Holter also revealed pauses.  Is now s/p pacemaker placement.  Has done well.  Follow.  Continue to follow up with Dr Lady GaryFath.

## 2013-06-18 NOTE — Assessment & Plan Note (Signed)
Low cholesterol diet.  Check lipid panel with next fasting labs.  

## 2013-06-18 NOTE — Assessment & Plan Note (Signed)
Low carb diet and exercise.  Follow met b and a1c.    

## 2013-06-18 NOTE — Assessment & Plan Note (Signed)
Blood pressure is under good control.  Same medications.  Follow metabolic panel.   

## 2013-06-18 NOTE — Assessment & Plan Note (Signed)
On oxygen.  Sees Dr Fleming.  Uses inhalers regularly.  Breathing stable.  Follow.   

## 2013-06-18 NOTE — Progress Notes (Signed)
Subjective:    Patient ID: Christine Bowen, female    DOB: 1922-12-31, 80 y.o.   MRN: [id number]  HPI 80 year old female with past histroy of hypertension, emphysema/asthma and hypercholesterolemia who comes in today as a work in with concerns regarding some increased fatigue.  She is accompanied by her daughter.  History obtained from both of them.  She feels her breathing is overall stable.  Using her inhalers.  Seeing Dr Meredeth Ide.  No chest pain or tightness.  Has done well s/p pacemaker placement.  Had placed 10/26/12.   Sees Dr Lady Gary. No acid reflux.  Eating and drinking well.  No nausea or vomiting.  No bowel change.  Overall she feels she is doing relatively well.  Reports has good and bad days.      Past Medical History  Diagnosis Date  . COPD (chronic obstructive pulmonary disease)   . Hypertension   . Hypercholesterolemia   . Diverticulosis   . Hyperglycemia     Current Outpatient Prescriptions on File Prior to Visit  Medication Sig Dispense Refill  . aspirin 325 MG tablet Take 325 mg by mouth daily.      . Fluticasone-Salmeterol (ADVAIR DISKUS) 250-50 MCG/DOSE AEPB Inhale 1 puff into the lungs every 12 (twelve) hours.      . hydrochlorothiazide (MICROZIDE) 12.5 MG capsule Take 12.5 mg by mouth daily.      . meclizine (ANTIVERT) 25 MG tablet Take 25 mg by mouth 2 (two) times daily as needed.      . metoprolol tartrate (LOPRESSOR) 25 MG tablet Take 25 mg by mouth daily.      . sertraline (ZOLOFT) 25 MG tablet Take 25 mg by mouth daily.      . Skin Protectants, Misc. (MINERIN) CREA Apply topically daily.      Marland Kitchen tiotropium (SPIRIVA) 18 MCG inhalation capsule Place 18 mcg into inhaler and inhale daily.      Marland Kitchen tobramycin (TOBREX) 0.3 % ophthalmic ointment Place 1 application into the right eye daily.      Marland Kitchen zolpidem (AMBIEN) 5 MG tablet 1/2 tablet qhs prn  30 tablet  0   No current facility-administered medications on file prior to visit.    Review of Systems Patient denies any  headache, lightheadedness or dizziness.  No significant sinus or allergy symptoms.  No chest pain, tightness or palpitations.  No increased cough or chest congestion.  She feels her breathing is stable.  No significant increased sob.  No nausea or vomiting.  No abdominal pain or cramping.  No bowel change, such as diarrhea, constipation, BRBPR or melana.  Urine change as outlined.  Has done well since her pacemaker placement.         Objective:   Physical Exam  Filed Vitals:   06/16/13 1549  BP: 130/80  Pulse: 85  Temp: 98.3 F (36.8 C)   Blood pressure recheck;  136/78 standing, lying 142-144/78, pulse 10  80 year old female in no acute distress.   HEENT:  Nares - clear.  OP- without lesions or erythema.  NECK:  Supple, nontender.   HEART:  Appears to be regular. LUNGS:  Without crackles or wheezing audible.  Respirations even and unlabored.  Good breath sounds bilaterally  RADIAL PULSE:  Equal bilaterally.  ABDOMEN:  Soft, nontender.     EXTREMITIES:  No increased edema to be present.  Stable.  No increased erythema.  Stasis changes.  Stable.  Assessment & Plan:  HEALTH MAINTENANCE.  Had declined mammograms and further colon evaluation or any further testing.  Declines bone density.  Was agreeable today for mammogram.

## 2013-06-20 ENCOUNTER — Other Ambulatory Visit (INDEPENDENT_AMBULATORY_CARE_PROVIDER_SITE_OTHER): Payer: Commercial Managed Care - HMO

## 2013-06-20 ENCOUNTER — Encounter: Payer: Self-pay | Admitting: *Deleted

## 2013-06-20 DIAGNOSIS — E7800 Pure hypercholesterolemia, unspecified: Secondary | ICD-10-CM

## 2013-06-20 DIAGNOSIS — R5383 Other fatigue: Secondary | ICD-10-CM

## 2013-06-20 DIAGNOSIS — R5381 Other malaise: Secondary | ICD-10-CM

## 2013-06-20 LAB — CBC WITH DIFFERENTIAL/PLATELET
Basophils Absolute: 0.1 10*3/uL (ref 0.0–0.1)
Basophils Relative: 0.6 % (ref 0.0–3.0)
EOS PCT: 1.9 % (ref 0.0–5.0)
Eosinophils Absolute: 0.2 10*3/uL (ref 0.0–0.7)
HEMATOCRIT: 43.3 % (ref 36.0–46.0)
Hemoglobin: 14.4 g/dL (ref 12.0–15.0)
LYMPHS ABS: 2.5 10*3/uL (ref 0.7–4.0)
Lymphocytes Relative: 27.4 % (ref 12.0–46.0)
MCHC: 33.2 g/dL (ref 30.0–36.0)
MCV: 87.1 fl (ref 78.0–100.0)
MONO ABS: 0.9 10*3/uL (ref 0.1–1.0)
Monocytes Relative: 9.5 % (ref 3.0–12.0)
NEUTROS ABS: 5.6 10*3/uL (ref 1.4–7.7)
Neutrophils Relative %: 60.6 % (ref 43.0–77.0)
PLATELETS: 315 10*3/uL (ref 150.0–400.0)
RBC: 4.97 Mil/uL (ref 3.87–5.11)
RDW: 14.5 % (ref 11.5–14.6)
WBC: 9.2 10*3/uL (ref 4.5–10.5)

## 2013-06-20 LAB — COMPREHENSIVE METABOLIC PANEL
ALT: 14 U/L (ref 0–35)
AST: 19 U/L (ref 0–37)
Albumin: 4.1 g/dL (ref 3.5–5.2)
Alkaline Phosphatase: 78 U/L (ref 39–117)
BILIRUBIN TOTAL: 0.8 mg/dL (ref 0.3–1.2)
BUN: 16 mg/dL (ref 6–23)
CO2: 31 mEq/L (ref 19–32)
Calcium: 9.6 mg/dL (ref 8.4–10.5)
Chloride: 101 mEq/L (ref 96–112)
Creatinine, Ser: 1 mg/dL (ref 0.4–1.2)
GFR: 54.29 mL/min — ABNORMAL LOW (ref >60.00)
Glucose, Bld: 94 mg/dL (ref 70–99)
Potassium: 3.8 mEq/L (ref 3.5–5.1)
Sodium: 138 mEq/L (ref 135–145)
Total Protein: 6.8 g/dL (ref 6.0–8.3)

## 2013-06-20 LAB — TSH: TSH: 5.44 u[IU]/mL (ref 0.35–5.50)

## 2013-06-20 LAB — LIPID PANEL
Cholesterol: 208 mg/dL — ABNORMAL HIGH (ref 0–200)
HDL: 52.4 mg/dL (ref >39.00)
LDL Cholesterol: 137 mg/dL — ABNORMAL HIGH (ref 0–99)
Total CHOL/HDL Ratio: 4
Triglycerides: 92 mg/dL (ref 0.0–149.0)
VLDL: 18.4 mg/dL (ref 0.0–40.0)

## 2013-07-10 ENCOUNTER — Telehealth: Payer: Self-pay | Admitting: Internal Medicine

## 2013-07-10 ENCOUNTER — Encounter: Payer: Self-pay | Admitting: Internal Medicine

## 2013-07-10 ENCOUNTER — Other Ambulatory Visit (INDEPENDENT_AMBULATORY_CARE_PROVIDER_SITE_OTHER): Payer: Commercial Managed Care - HMO

## 2013-07-10 DIAGNOSIS — N390 Urinary tract infection, site not specified: Secondary | ICD-10-CM

## 2013-07-10 LAB — POCT URINALYSIS DIPSTICK
Bilirubin, UA: NEGATIVE
Glucose, UA: NEGATIVE
KETONES UA: NEGATIVE
LEUKOCYTES UA: NEGATIVE
Nitrite, UA: NEGATIVE
PH UA: 6
PROTEIN UA: NEGATIVE
RBC UA: NEGATIVE
Urobilinogen, UA: 0.2

## 2013-07-10 NOTE — Telephone Encounter (Signed)
Letter dictated.  Will place in your box.

## 2013-07-10 NOTE — Telephone Encounter (Signed)
Order for a U/A . [phone]

## 2013-07-10 NOTE — Telephone Encounter (Signed)
Needs order for UA faxed (Okay to create as a letter in epic)

## 2013-07-11 NOTE — Telephone Encounter (Signed)
Letter faxed.

## 2013-08-22 ENCOUNTER — Ambulatory Visit (INDEPENDENT_AMBULATORY_CARE_PROVIDER_SITE_OTHER): Payer: Commercial Managed Care - HMO | Admitting: Internal Medicine

## 2013-08-22 ENCOUNTER — Encounter: Payer: Self-pay | Admitting: Internal Medicine

## 2013-08-22 VITALS — BP 120/80 | HR 85 | Temp 98.2°F | Ht 61.0 in | Wt 127.5 lb

## 2013-08-22 DIAGNOSIS — R112 Nausea with vomiting, unspecified: Secondary | ICD-10-CM

## 2013-08-22 DIAGNOSIS — R5381 Other malaise: Secondary | ICD-10-CM

## 2013-08-22 DIAGNOSIS — I10 Essential (primary) hypertension: Secondary | ICD-10-CM

## 2013-08-22 DIAGNOSIS — I495 Sick sinus syndrome: Secondary | ICD-10-CM

## 2013-08-22 DIAGNOSIS — E7800 Pure hypercholesterolemia, unspecified: Secondary | ICD-10-CM

## 2013-08-22 DIAGNOSIS — R531 Weakness: Secondary | ICD-10-CM

## 2013-08-22 DIAGNOSIS — R7309 Other abnormal glucose: Secondary | ICD-10-CM

## 2013-08-22 DIAGNOSIS — J449 Chronic obstructive pulmonary disease, unspecified: Secondary | ICD-10-CM

## 2013-08-22 DIAGNOSIS — R739 Hyperglycemia, unspecified: Secondary | ICD-10-CM

## 2013-08-22 DIAGNOSIS — R5383 Other fatigue: Secondary | ICD-10-CM

## 2013-08-22 LAB — CBC WITH DIFFERENTIAL/PLATELET
BASOS PCT: 0.6 % (ref 0.0–3.0)
Basophils Absolute: 0.1 10*3/uL (ref 0.0–0.1)
EOS PCT: 1.1 % (ref 0.0–5.0)
Eosinophils Absolute: 0.1 10*3/uL (ref 0.0–0.7)
HEMATOCRIT: 44.2 % (ref 36.0–46.0)
Hemoglobin: 14.7 g/dL (ref 12.0–15.0)
LYMPHS PCT: 24.5 % (ref 12.0–46.0)
Lymphs Abs: 2.5 10*3/uL (ref 0.7–4.0)
MCHC: 33.4 g/dL (ref 30.0–36.0)
MCV: 88 fl (ref 78.0–100.0)
MONOS PCT: 9.1 % (ref 3.0–12.0)
Monocytes Absolute: 0.9 10*3/uL (ref 0.1–1.0)
NEUTROS PCT: 64.7 % (ref 43.0–77.0)
Neutro Abs: 6.5 10*3/uL (ref 1.4–7.7)
Platelets: 359 10*3/uL (ref 150.0–400.0)
RBC: 5.01 Mil/uL (ref 3.87–5.11)
RDW: 14.8 % (ref 11.5–15.5)
WBC: 10.1 10*3/uL (ref 4.0–10.5)

## 2013-08-22 LAB — COMPREHENSIVE METABOLIC PANEL
ALT: 14 U/L (ref 0–35)
AST: 16 U/L (ref 0–37)
Albumin: 3.6 g/dL (ref 3.5–5.2)
Alkaline Phosphatase: 72 U/L (ref 39–117)
BUN: 23 mg/dL (ref 6–23)
CO2: 31 meq/L (ref 19–32)
Calcium: 9.7 mg/dL (ref 8.4–10.5)
Chloride: 102 mEq/L (ref 96–112)
Creatinine, Ser: 1.3 mg/dL — ABNORMAL HIGH (ref 0.4–1.2)
GFR: 41.39 mL/min — ABNORMAL LOW (ref >60.00)
GLUCOSE: 93 mg/dL (ref 70–99)
POTASSIUM: 4.6 meq/L (ref 3.5–5.1)
Sodium: 141 mEq/L (ref 135–145)
Total Bilirubin: 0.6 mg/dL (ref 0.2–1.2)
Total Protein: 6.4 g/dL (ref 6.0–8.3)

## 2013-08-22 LAB — VITAMIN B12: Vitamin B-12: 203 pg/mL — ABNORMAL LOW (ref 211–911)

## 2013-08-22 NOTE — Progress Notes (Signed)
Subjective:    Patient ID: Christine Bowen, female    DOB: Nov 10, 1922, 80 y.o.   MRN: [id number]  HPI 80 year old female with past histroy of hypertension, emphysema/asthma and hypercholesterolemia who comes in today for a scheduled follow up.  She is accompanied by her daughter.  History obtained from both of them.  She feels her breathing is overall stable.  Using her inhalers.  Seeing Dr Meredeth Ide.  No chest pain or tightness.  Has done well s/p pacemaker placement.  Had placed 10/26/12.   Sees Dr Lady Gary.  No acid reflux.  Eating and drinking well.  No nausea or vomiting.  No bowel change.  Overall she feels she is doing relatively well.  Reports increased fatigue.  Feels weak at times.  Does not get up and move/walk around a lot.  Discussed strengthening exercises and walking more.  Discussed physical therapy for strengthening.  She was in agreement.  Daughter does report some intermittent episodes of nausea and loose stool (at times).  Just had an episode this past weekend of nausea and vomiting.  States usually no emesis, just nausea.      Past Medical History  Diagnosis Date  . COPD (chronic obstructive pulmonary disease)   . Hypertension   . Hypercholesterolemia   . Diverticulosis   . Hyperglycemia     Current Outpatient Prescriptions on File Prior to Visit  Medication Sig Dispense Refill  . alum & mag hydroxide-simeth (MAALOX PLUS) 400-400-40 MG/5ML suspension Take 30 mLs by mouth every 6 (six) hours as needed for indigestion.      Marland Kitchen aspirin 325 MG tablet Take 325 mg by mouth daily.      . Fluticasone-Salmeterol (ADVAIR DISKUS) 250-50 MCG/DOSE AEPB Inhale 1 puff into the lungs every 12 (twelve) hours.      . hydrochlorothiazide (MICROZIDE) 12.5 MG capsule Take 12.5 mg by mouth daily.      . magnesium hydroxide (MILK OF MAGNESIA) 400 MG/5ML suspension Take 30 mLs by mouth at bedtime as needed for mild constipation.      . meclizine (ANTIVERT) 25 MG tablet Take 25 mg by mouth 2 (two) times  daily as needed.      . metoprolol tartrate (LOPRESSOR) 25 MG tablet Take 25 mg by mouth daily.      . sertraline (ZOLOFT) 25 MG tablet Take 25 mg by mouth daily.      . Skin Protectants, Misc. (MINERIN) CREA Apply topically daily.      Marland Kitchen tiotropium (SPIRIVA) 18 MCG inhalation capsule Place 18 mcg into inhaler and inhale daily.      Marland Kitchen tobramycin (TOBREX) 0.3 % ophthalmic ointment Place 1 application into the right eye daily.      Marland Kitchen zolpidem (AMBIEN) 5 MG tablet 1/2 tablet qhs prn  30 tablet  0   No current facility-administered medications on file prior to visit.    Review of Systems Patient denies any headache, lightheadedness or dizziness.  No significant sinus or allergy symptoms.  No chest pain, tightness or palpitations.  No increased cough or chest congestion.  She feels her breathing is stable.  No significant increased sob.  Some intermittent episodes of nausea.  Occasional loose stool.  Usually no vomiting, but did have an episode this weekend.  No vomiting now.  Back to eating.   No abdominal pain or cramping.  No bowel change, such as significant constipation, BRBPR or melana.   Has done well since her pacemaker placement.   Increased fatigue.  Objective:   Physical Exam  Filed Vitals:   08/22/13 1407  BP: 120/80  Pulse: 85  Temp: 98.2 F (36.8 C)   Blood pressure recheck:  58122/2668  [age] year old female in no acute distress.   HEENT:  Nares - clear.  OP- without lesions or erythema.  NECK:  Supple, nontender.   HEART:  Appears to be regular. LUNGS:  Without crackles or wheezing audible.  Respirations even and unlabored.  Good breath sounds bilaterally  RADIAL PULSE:  Equal bilaterally.  ABDOMEN:  Soft, nontender.     EXTREMITIES:  No increased edema to be present.  Stable.  No increased erythema.  Stasis changes.  Stable.            Assessment & Plan:  HEALTH MAINTENANCE.  Had declined mammograms and further colon evaluation or any further testing.  Declines bone  density.  Was agreeable for mammogram.  Scheduled.  No results.    I spent 25 minutes with the patient and more than 50% of the time was spent in consultation regarding the above.

## 2013-08-23 ENCOUNTER — Encounter: Payer: Self-pay | Admitting: *Deleted

## 2013-08-24 ENCOUNTER — Telehealth: Payer: Self-pay | Admitting: *Deleted

## 2013-08-24 NOTE — Telephone Encounter (Signed)
Called Beth back and given new Rxs and orders faxed to [facility]

## 2013-08-24 NOTE — Telephone Encounter (Signed)
Beth from pharmacy left VM, stating she had received a Rx for Protonix which is listed on patient's allergies. Also, benefiber not available, Metamucil would be an option.

## 2013-08-24 NOTE — Telephone Encounter (Signed)
D/c order for protonix and start zantac 150mg  q day.  D/c benefiber and start metamucil - mix in am drink and take daily.  If any problems,  let us know.  Form in Christine Bowen's box.

## 2013-08-27 ENCOUNTER — Encounter: Payer: Self-pay | Admitting: Internal Medicine

## 2013-08-27 NOTE — Assessment & Plan Note (Signed)
Low cholesterol diet.  Check lipid panel with next fasting labs.  

## 2013-08-27 NOTE — Assessment & Plan Note (Signed)
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  Holter also revealed pauses.  Is now s/p pacemaker placement.  Has done well.  Follow.  Continue to follow up with Dr Lady Gary.

## 2013-08-27 NOTE — Assessment & Plan Note (Signed)
Blood pressure is under good control.  Same medications.  Follow metabolic panel.

## 2013-08-27 NOTE — Assessment & Plan Note (Addendum)
Has some intermittent nausea.  This weekend had some nausea and vomiting.  Resolved now.  Will check cbc, met b and liver panel.  Start zantac as directed.  She declines any further scanning or testing.  Instructed to notify me or be reevaluated if symptoms persist.  Need to stay hydrated.

## 2013-08-27 NOTE — Assessment & Plan Note (Signed)
On oxygen.  Sees Dr Meredeth Ide.  Uses inhalers regularly.  Breathing stable.  Follow.

## 2013-08-27 NOTE — Assessment & Plan Note (Signed)
Low carb diet and exercise.  Follow met b and a1c.    

## 2013-08-27 NOTE — Assessment & Plan Note (Signed)
Increased fatigue with associated weakness.  Not walking and ambulating regularly.  Discussed this with her and her daughter at length.  Feel the fatigue is multifactorial.  Will check cbc, met c, tsh and B12.  Also stay hydrated.  Will have physical therapy evaluate and treat for strengthening exercise and ambulation.  Pt in agreement.

## 2013-08-30 ENCOUNTER — Telehealth: Payer: Self-pay | Admitting: *Deleted

## 2013-08-30 ENCOUNTER — Other Ambulatory Visit: Payer: Self-pay | Admitting: *Deleted

## 2013-08-30 DIAGNOSIS — N289 Disorder of kidney and ureter, unspecified: Secondary | ICD-10-CM

## 2013-08-30 NOTE — Telephone Encounter (Signed)
Order placed for f/u labs.  

## 2013-08-30 NOTE — Telephone Encounter (Signed)
Order placed for f/u met b 

## 2013-08-30 NOTE — Telephone Encounter (Signed)
Pt is coming tomorrow what labs and dx?  

## 2013-08-31 ENCOUNTER — Ambulatory Visit (INDEPENDENT_AMBULATORY_CARE_PROVIDER_SITE_OTHER): Payer: Commercial Managed Care - HMO | Admitting: *Deleted

## 2013-08-31 ENCOUNTER — Other Ambulatory Visit (INDEPENDENT_AMBULATORY_CARE_PROVIDER_SITE_OTHER): Payer: Commercial Managed Care - HMO

## 2013-08-31 DIAGNOSIS — N289 Disorder of kidney and ureter, unspecified: Secondary | ICD-10-CM

## 2013-08-31 DIAGNOSIS — E538 Deficiency of other specified B group vitamins: Secondary | ICD-10-CM

## 2013-08-31 LAB — BASIC METABOLIC PANEL
BUN: 13 mg/dL (ref 6–23)
CALCIUM: 9.3 mg/dL (ref 8.4–10.5)
CO2: 31 meq/L (ref 19–32)
CREATININE: 1 mg/dL (ref 0.4–1.2)
Chloride: 103 mEq/L (ref 96–112)
GFR: 56.17 mL/min — ABNORMAL LOW (ref >60.00)
Glucose, Bld: 87 mg/dL (ref 70–99)
Potassium: 3.9 mEq/L (ref 3.5–5.1)
Sodium: 140 mEq/L (ref 135–145)

## 2013-08-31 MED ORDER — CYANOCOBALAMIN 1000 MCG/ML IJ SOLN
1000.0000 ug | Freq: Once | INTRAMUSCULAR | Status: AC
  Administered 2013-08-31: 1000 ug via INTRAMUSCULAR

## 2013-09-04 ENCOUNTER — Encounter: Payer: Self-pay | Admitting: Internal Medicine

## 2013-09-06 NOTE — Telephone Encounter (Signed)
Unread mychart message mailed to patient 

## 2013-09-07 ENCOUNTER — Ambulatory Visit (INDEPENDENT_AMBULATORY_CARE_PROVIDER_SITE_OTHER): Payer: Commercial Managed Care - HMO | Admitting: *Deleted

## 2013-09-07 DIAGNOSIS — E538 Deficiency of other specified B group vitamins: Secondary | ICD-10-CM

## 2013-09-07 MED ORDER — CYANOCOBALAMIN 1000 MCG/ML IJ SOLN
1000.0000 ug | Freq: Once | INTRAMUSCULAR | Status: AC
  Administered 2013-09-07: 1000 ug via INTRAMUSCULAR

## 2013-09-14 ENCOUNTER — Telehealth: Payer: Self-pay | Admitting: Internal Medicine

## 2013-09-14 ENCOUNTER — Encounter: Payer: Self-pay | Admitting: Internal Medicine

## 2013-09-14 ENCOUNTER — Ambulatory Visit: Payer: Commercial Managed Care - HMO

## 2013-09-14 NOTE — Telephone Encounter (Signed)
[facility]. Faxed signed order to [facility] to d/c benefiber

## 2013-09-14 NOTE — Telephone Encounter (Signed)
Received a my chart message from pts daughter [name].  Pt has been having more problems since starting benefiber.  I am ok with stopping the benefiber.  Please notify [facility] to stop the benefiber.  If [name] continues to have problems, she will need to be seen.  If any acute problems now with abdominal pain and difficulty going to the bathroom, I would recommend going ahead and taking her to acute care for evaluation - to confirm nothing more acute going on.    I will also send [name] a message.  Please notify [facility] to d/c the benefiber.  Thanks.

## 2013-09-15 ENCOUNTER — Encounter: Payer: Self-pay | Admitting: Internal Medicine

## 2013-09-15 NOTE — Telephone Encounter (Signed)
Pt's daughter also responded to mychart, see other encounter.

## 2013-09-18 ENCOUNTER — Emergency Department: Payer: Self-pay | Admitting: Emergency Medicine

## 2013-09-18 ENCOUNTER — Telehealth: Payer: Self-pay | Admitting: Internal Medicine

## 2013-09-18 NOTE — Telephone Encounter (Signed)
[facility] call to notify Dr. Lorin PicketScott that the patient was sent to the hospital for a fall and skin tear.msn

## 2013-09-18 NOTE — Telephone Encounter (Signed)
Noted.  Keep us posted.  Let us know if she needs anything.   

## 2013-09-19 ENCOUNTER — Ambulatory Visit: Payer: Commercial Managed Care - HMO

## 2013-09-26 ENCOUNTER — Emergency Department: Payer: Self-pay | Admitting: Emergency Medicine

## 2013-09-26 ENCOUNTER — Encounter: Payer: Self-pay | Admitting: Internal Medicine

## 2013-09-26 ENCOUNTER — Ambulatory Visit: Payer: Commercial Managed Care - HMO

## 2013-09-26 LAB — URINALYSIS, COMPLETE
BILIRUBIN, UR: NEGATIVE
Bacteria: NONE SEEN
Blood: NEGATIVE
Glucose,UR: NEGATIVE mg/dL (ref 0–75)
Ketone: NEGATIVE
NITRITE: NEGATIVE
PROTEIN: NEGATIVE
Ph: 7 (ref 4.5–8.0)
SPECIFIC GRAVITY: 1.008 (ref 1.003–1.030)
Squamous Epithelial: 1

## 2013-09-26 LAB — COMPREHENSIVE METABOLIC PANEL
ALT: 13 U/L (ref 12–78)
Albumin: 3.4 g/dL (ref 3.4–5.0)
Alkaline Phosphatase: 93 U/L
Anion Gap: 7 (ref 7–16)
BUN: 17 mg/dL (ref 7–18)
Bilirubin,Total: 0.6 mg/dL (ref 0.2–1.0)
CALCIUM: 9.1 mg/dL (ref 8.5–10.1)
CO2: 32 mmol/L (ref 21–32)
CREATININE: 1.06 mg/dL (ref 0.60–1.30)
Chloride: 100 mmol/L (ref 98–107)
EGFR (African American): 54 — ABNORMAL LOW
EGFR (Non-African Amer.): 47 — ABNORMAL LOW
GLUCOSE: 117 mg/dL — AB (ref 65–99)
OSMOLALITY: 280 (ref 275–301)
Potassium: 4 mmol/L (ref 3.5–5.1)
SGOT(AST): 13 U/L — ABNORMAL LOW (ref 15–37)
Sodium: 139 mmol/L (ref 136–145)
Total Protein: 6.3 g/dL — ABNORMAL LOW (ref 6.4–8.2)

## 2013-09-26 LAB — CBC
HCT: 42.7 % (ref 35.0–47.0)
HGB: 14.3 g/dL (ref 12.0–16.0)
MCH: 29.4 pg (ref 26.0–34.0)
MCHC: 33.4 g/dL (ref 32.0–36.0)
MCV: 88 fL (ref 80–100)
Platelet: 293 10*3/uL (ref 150–440)
RBC: 4.84 10*6/uL (ref 3.80–5.20)
RDW: 13.9 % (ref 11.5–14.5)
WBC: 9.6 10*3/uL (ref 3.6–11.0)

## 2013-09-26 LAB — TROPONIN I: Troponin-I: <0.02 ng/mL

## 2013-09-26 NOTE — Telephone Encounter (Signed)
I also called and spoke with [name] & gave her the same information

## 2013-09-27 ENCOUNTER — Encounter: Payer: Self-pay | Admitting: Internal Medicine

## 2013-09-27 ENCOUNTER — Telehealth: Payer: Self-pay | Admitting: Internal Medicine

## 2013-09-27 NOTE — Telephone Encounter (Signed)
Pt went to ER [facility] 09/26/13  Needs records.  Thanks.

## 2013-09-28 NOTE — Telephone Encounter (Signed)
Records requested

## 2013-09-28 NOTE — Telephone Encounter (Signed)
Will review 

## 2013-09-28 NOTE — Telephone Encounter (Signed)
ER records received & placed in your folder

## 2013-10-10 ENCOUNTER — Telehealth: Payer: Self-pay | Admitting: *Deleted

## 2013-10-10 NOTE — Telephone Encounter (Signed)
Blood pressure was up this am, appears to be better now.  I recommend for them to continue to monitor and if problems, let me know.  Confirm no acute issues.

## 2013-10-10 NOTE — Telephone Encounter (Signed)
[facility]ine HarmanDana called to report pts BP this morning around 9:30am was 147/101.  At 3pm during this call BP was retaken and it is 119/81.  Please advise

## 2013-10-10 NOTE — Telephone Encounter (Signed)
No acuter issues just elevated BP this morning

## 2013-10-24 ENCOUNTER — Encounter: Payer: Self-pay | Admitting: Internal Medicine

## 2013-10-26 ENCOUNTER — Ambulatory Visit (INDEPENDENT_AMBULATORY_CARE_PROVIDER_SITE_OTHER): Payer: Commercial Managed Care - HMO | Admitting: *Deleted

## 2013-10-26 DIAGNOSIS — E538 Deficiency of other specified B group vitamins: Secondary | ICD-10-CM

## 2013-10-26 MED ORDER — CYANOCOBALAMIN 1000 MCG/ML IJ SOLN
1000.0000 ug | Freq: Once | INTRAMUSCULAR | Status: AC
  Administered 2013-10-26: 1000 ug via INTRAMUSCULAR

## 2013-11-02 ENCOUNTER — Ambulatory Visit (INDEPENDENT_AMBULATORY_CARE_PROVIDER_SITE_OTHER): Payer: Commercial Managed Care - HMO | Admitting: *Deleted

## 2013-11-02 DIAGNOSIS — E538 Deficiency of other specified B group vitamins: Secondary | ICD-10-CM

## 2013-11-02 MED ORDER — CYANOCOBALAMIN 1000 MCG/ML IJ SOLN
1000.0000 ug | Freq: Once | INTRAMUSCULAR | Status: AC
  Administered 2013-11-02: 1000 ug via INTRAMUSCULAR

## 2013-11-27 ENCOUNTER — Ambulatory Visit (INDEPENDENT_AMBULATORY_CARE_PROVIDER_SITE_OTHER): Payer: Commercial Managed Care - HMO | Admitting: Internal Medicine

## 2013-11-27 ENCOUNTER — Encounter: Payer: Self-pay | Admitting: Internal Medicine

## 2013-11-27 VITALS — BP 120/84 | HR 92 | Temp 97.9°F | Ht 61.0 in | Wt 127.5 lb

## 2013-11-27 DIAGNOSIS — R7309 Other abnormal glucose: Secondary | ICD-10-CM

## 2013-11-27 DIAGNOSIS — E7800 Pure hypercholesterolemia, unspecified: Secondary | ICD-10-CM

## 2013-11-27 DIAGNOSIS — E538 Deficiency of other specified B group vitamins: Secondary | ICD-10-CM

## 2013-11-27 DIAGNOSIS — I495 Sick sinus syndrome: Secondary | ICD-10-CM

## 2013-11-27 DIAGNOSIS — R5383 Other fatigue: Secondary | ICD-10-CM

## 2013-11-27 DIAGNOSIS — J449 Chronic obstructive pulmonary disease, unspecified: Secondary | ICD-10-CM

## 2013-11-27 DIAGNOSIS — R739 Hyperglycemia, unspecified: Secondary | ICD-10-CM

## 2013-11-27 DIAGNOSIS — Z23 Encounter for immunization: Secondary | ICD-10-CM

## 2013-11-27 DIAGNOSIS — R5381 Other malaise: Secondary | ICD-10-CM

## 2013-11-27 DIAGNOSIS — I10 Essential (primary) hypertension: Secondary | ICD-10-CM

## 2013-11-27 NOTE — Progress Notes (Signed)
Pre visit review using our clinic review tool, if applicable. No additional management support is needed unless otherwise documented below in the visit note. 

## 2013-11-30 ENCOUNTER — Encounter: Payer: Self-pay | Admitting: Internal Medicine

## 2013-11-30 DIAGNOSIS — E538 Deficiency of other specified B group vitamins: Secondary | ICD-10-CM | POA: Insufficient documentation

## 2013-11-30 NOTE — Assessment & Plan Note (Signed)
Continue B12 injections.   

## 2013-11-30 NOTE — Progress Notes (Signed)
Subjective:    Patient ID: Christine Bowen, female    DOB: 1923-03-25, 80 y.o.   MRN: [id number]  HPI 80 year old female with past histroy of hypertension, emphysema/asthma and hypercholesterolemia who comes in today for a scheduled follow up.  She is accompanied by her daughter.  History obtained from both of them.  She feels her breathing is overall stable.  Using her inhalers.  Seeing Dr Meredeth Ide.  No chest pain or tightness.  Has done well s/p pacemaker placement.  Had placed 10/26/12.   Sees Dr Lady Gary.  No acid reflux.  Eating and drinking well.  No nausea or vomiting.  No bowel change.  Overall she feels she is doing better.  Fatigue appears to be some better.  She is receiving B12 injections now.   Daughter had previously reported some intermittent episodes of nausea and loose stool (at times).  This does not occur frequently.  Appears to be better since off ensure.       Past Medical History  Diagnosis Date  . COPD (chronic obstructive pulmonary disease)   . Hypertension   . Hypercholesterolemia   . Diverticulosis   . Hyperglycemia     Current Outpatient Prescriptions on File Prior to Visit  Medication Sig Dispense Refill  . alum & mag hydroxide-simeth (MAALOX PLUS) 400-400-40 MG/5ML suspension Take 30 mLs by mouth every 6 (six) hours as needed for indigestion.      Marland Kitchen aspirin 325 MG tablet Take 325 mg by mouth daily.      . Fluticasone-Salmeterol (ADVAIR DISKUS) 250-50 MCG/DOSE AEPB Inhale 1 puff into the lungs every 12 (twelve) hours.      . magnesium hydroxide (MILK OF MAGNESIA) 400 MG/5ML suspension Take 30 mLs by mouth at bedtime as needed for mild constipation.      . metoprolol tartrate (LOPRESSOR) 25 MG tablet Take 25 mg by mouth daily.      . sertraline (ZOLOFT) 25 MG tablet Take 25 mg by mouth daily.      . Skin Protectants, Misc. (MINERIN) CREA Apply topically daily.      Marland Kitchen tiotropium (SPIRIVA) 18 MCG inhalation capsule Place 18 mcg into inhaler and inhale daily.      Marland Kitchen  tobramycin (TOBREX) 0.3 % ophthalmic ointment Place 1 application into the right eye daily.      . meclizine (ANTIVERT) 25 MG tablet Take 25 mg by mouth 2 (two) times daily as needed.       No current facility-administered medications on file prior to visit.    Review of Systems Patient denies any headache, lightheadedness or dizziness.  No significant sinus or allergy symptoms.  No chest pain, tightness or palpitations.  No increased cough or chest congestion.  She feels her breathing is stable.  No significant increased sob.  Occasional loose stool.  Appears to be better since off Ensure.  No vomiting.  No abdominal pain or cramping.  No bowel change, such as significant constipation, BRBPR or melana.   Has done well since her pacemaker placement.   Increased fatigue.  Appears to be some better.       Objective:   Physical Exam  Filed Vitals:   11/27/13 1629  BP: 120/84  Pulse: 92  Temp: 97.9 F (32.48 C)   80 year old female in no acute distress.   HEENT:  Nares - clear.  OP- without lesions or erythema.  NECK:  Supple, nontender.   HEART:  Appears to be regular. LUNGS:  Without crackles  or wheezing audible.  Respirations even and unlabored.  Good breath sounds bilaterally  RADIAL PULSE:  Equal bilaterally.  ABDOMEN:  Soft, nontender.     EXTREMITIES:  No increased edema to be present.  Stable.  No increased erythema.  Stasis changes.  Stable.            Assessment & Plan:  HEALTH MAINTENANCE.  Had declined mammograms and further colon evaluation or any further testing.  Declines bone density.  Was agreeable for mammogram.  Scheduled.  Appears to have not had.  Will clarify.    I spent 25 minutes with the patient and more than 50% of the time was spent in consultation regarding the above.

## 2013-11-30 NOTE — Assessment & Plan Note (Signed)
Blood pressure is under good control.  Same medications.  Follow metabolic panel.   

## 2013-11-30 NOTE — Assessment & Plan Note (Signed)
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  Holter also revealed pauses.  Is now s/p pacemaker placement.  Has done well.  Follow.  Continue to follow up with Dr Fath.    

## 2013-11-30 NOTE — Assessment & Plan Note (Signed)
Sugar wnl on last few checks.  Follow.

## 2013-11-30 NOTE — Assessment & Plan Note (Signed)
On oxygen.  Sees Dr Fleming.  Uses inhalers regularly.  Breathing stable.  Follow.   

## 2013-11-30 NOTE — Assessment & Plan Note (Signed)
Low cholesterol diet.  Check lipid panel with next fasting labs.  

## 2013-11-30 NOTE — Assessment & Plan Note (Signed)
Overall appears to be improved.  Follow.

## 2013-12-05 ENCOUNTER — Ambulatory Visit (INDEPENDENT_AMBULATORY_CARE_PROVIDER_SITE_OTHER): Payer: Commercial Managed Care - HMO | Admitting: *Deleted

## 2013-12-05 DIAGNOSIS — E538 Deficiency of other specified B group vitamins: Secondary | ICD-10-CM

## 2013-12-05 MED ORDER — CYANOCOBALAMIN 1000 MCG/ML IJ SOLN
1000.0000 ug | Freq: Once | INTRAMUSCULAR | Status: AC
  Administered 2013-12-05: 1000 ug via INTRAMUSCULAR

## 2013-12-13 ENCOUNTER — Telehealth: Payer: Self-pay | Admitting: *Deleted

## 2013-12-13 NOTE — Telephone Encounter (Signed)
Laverne from [employer] called states pts family reports pt as being weak with unstable gait.  Pt is not drinking a lot, they fear she is dehydrated.  Clide Cliff is requesting an order for a UA be faxed to her at [phone].

## 2013-12-13 NOTE — Telephone Encounter (Signed)
Ok to check urinalysis and culture.  I am not in the office.  Can you get Raquel or Dr Darrick Huntsman to sign to fax down.   Thanks.  Also, encourage increased po intake.  If any acute change, etc - needs eval.

## 2013-12-14 NOTE — Telephone Encounter (Signed)
Order faxed by Glee Arvin, CMA as requested

## 2013-12-19 ENCOUNTER — Telehealth: Payer: Self-pay | Admitting: Internal Medicine

## 2013-12-19 NOTE — Telephone Encounter (Signed)
Faxed order to [facility]

## 2013-12-19 NOTE — Telephone Encounter (Signed)
Pt resides at [facility].  Her daughter checks her my chart account.  Please notify [facility] that her urine culture does reveal an infection.  Confirm pt is not allergic to amoxicillin/pcn.  If no, then augmentin  bid x 1 week.  Have them start align (probiotic) one po q day x 3 weeks.  Keep [place] posted on her progress.

## 2014-01-01 ENCOUNTER — Encounter: Payer: Self-pay | Admitting: Internal Medicine

## 2014-01-03 DIAGNOSIS — Z7689 Persons encountering health services in other specified circumstances: Secondary | ICD-10-CM

## 2014-01-09 ENCOUNTER — Ambulatory Visit (INDEPENDENT_AMBULATORY_CARE_PROVIDER_SITE_OTHER): Payer: Commercial Managed Care - HMO | Admitting: *Deleted

## 2014-01-09 DIAGNOSIS — E538 Deficiency of other specified B group vitamins: Secondary | ICD-10-CM

## 2014-01-09 MED ORDER — CYANOCOBALAMIN 1000 MCG/ML IJ SOLN
1000.0000 ug | Freq: Once | INTRAMUSCULAR | Status: AC
  Administered 2014-01-09: 1000 ug via INTRAMUSCULAR

## 2014-01-25 ENCOUNTER — Encounter: Payer: Self-pay | Admitting: Internal Medicine

## 2014-01-30 ENCOUNTER — Encounter: Payer: Self-pay | Admitting: *Deleted

## 2014-02-07 ENCOUNTER — Encounter: Payer: Self-pay | Admitting: Internal Medicine

## 2014-02-08 ENCOUNTER — Ambulatory Visit: Payer: Commercial Managed Care - HMO

## 2014-02-08 DIAGNOSIS — E538 Deficiency of other specified B group vitamins: Secondary | ICD-10-CM

## 2014-02-08 MED ORDER — CYANOCOBALAMIN 1000 MCG/ML IJ SOLN
1000.0000 ug | Freq: Once | INTRAMUSCULAR | Status: AC
  Administered 2014-02-08: 1000 ug via INTRAMUSCULAR

## 2014-02-08 MED ORDER — CYANOCOBALAMIN 1000 MCG/ML IJ SOLN: 1000.0000 ug | Freq: Once | INTRAMUSCULAR | Status: DC

## 2014-02-14 ENCOUNTER — Ambulatory Visit (INDEPENDENT_AMBULATORY_CARE_PROVIDER_SITE_OTHER): Payer: Commercial Managed Care - HMO | Admitting: Internal Medicine

## 2014-02-14 ENCOUNTER — Encounter: Payer: Self-pay | Admitting: Internal Medicine

## 2014-02-14 VITALS — BP 118/62 | HR 86 | Temp 97.9°F | Ht 61.0 in | Wt 126.8 lb

## 2014-02-14 DIAGNOSIS — R739 Hyperglycemia, unspecified: Secondary | ICD-10-CM

## 2014-02-14 DIAGNOSIS — E538 Deficiency of other specified B group vitamins: Secondary | ICD-10-CM

## 2014-02-14 DIAGNOSIS — E7800 Pure hypercholesterolemia, unspecified: Secondary | ICD-10-CM

## 2014-02-14 DIAGNOSIS — R5383 Other fatigue: Secondary | ICD-10-CM

## 2014-02-14 DIAGNOSIS — I10 Essential (primary) hypertension: Secondary | ICD-10-CM

## 2014-02-14 DIAGNOSIS — J449 Chronic obstructive pulmonary disease, unspecified: Secondary | ICD-10-CM

## 2014-02-14 DIAGNOSIS — I495 Sick sinus syndrome: Secondary | ICD-10-CM

## 2014-02-14 NOTE — Progress Notes (Signed)
Pre visit review using our clinic review tool, if applicable. No additional management support is needed unless otherwise documented below in the visit note. 

## 2014-02-18 ENCOUNTER — Encounter: Payer: Self-pay | Admitting: Internal Medicine

## 2014-02-18 NOTE — Progress Notes (Signed)
Subjective:    Patient ID: Christine Bowen, female    DOB: 05/15/1922, 80 y.o.   MRN: [id number]  HPI 80 year old female with past histroy of hypertension, emphysema/asthma and hypercholesterolemia who comes in today as a work in accompanied by her daughter.  History obtained from both of them.  Daughter called concerned that Christine Bowen is more fatigued.   She feels her breathing is overall stable.  Using her inhalers.  Seeing Dr Meredeth IdeFleming.  No chest pain or tightness.  Has done well s/p pacemaker placement.  Had placed 10/26/12.   Sees Dr Lady GaryFath.  No acid reflux.  Eating and drinking well.  No nausea or vomiting.  No bowel change.  Overall she feels she is doing ok.  She is receiving B12 injections now.  Daughter is concerned that she has not been going to the dining room as often.   Also was concerned that she was refusing a bath at times.  After discussion, it appears she has been going to the dining hall.  Just intermittently misses going to the dining hall.  She is eating.  Breathing overall stable.  Prefers to take baths and not showers.  Overall she feels she is stable.      Past Medical History  Diagnosis Date  . COPD (chronic obstructive pulmonary disease)   . Hypertension   . Hypercholesterolemia   . Diverticulosis   . Hyperglycemia     Current Outpatient Prescriptions on File Prior to Visit  Medication Sig Dispense Refill  . alum & mag hydroxide-simeth (MAALOX PLUS) 400-400-40 MG/5ML suspension Take 30 mLs by mouth every 6 (six) hours as needed for indigestion.    Marland Kitchen. aspirin 325 MG tablet Take 325 mg by mouth daily.    . Fluticasone-Salmeterol (ADVAIR DISKUS) 250-50 MCG/DOSE AEPB Inhale 1 puff into the lungs every 12 (twelve) hours.    . magnesium hydroxide (MILK OF MAGNESIA) 400 MG/5ML suspension Take 30 mLs by mouth at bedtime as needed for mild constipation.    . metoprolol tartrate (LOPRESSOR) 25 MG tablet Take 25 mg by mouth daily.    . ranitidine (ZANTAC) 150 MG tablet Take 150 mg  by mouth daily.    . sertraline (ZOLOFT) 25 MG tablet Take 25 mg by mouth daily.    Marland Kitchen. tiotropium (SPIRIVA) 18 MCG inhalation capsule Place 18 mcg into inhaler and inhale daily.    Marland Kitchen. tobramycin (TOBREX) 0.3 % ophthalmic ointment Place 1 application into the right eye daily.     No current facility-administered medications on file prior to visit.    Review of Systems Patient denies any headache, lightheadedness or dizziness.  No significant sinus or allergy symptoms.  No chest pain, tightness or palpitations.  No increased cough or chest congestion.  She feels her breathing is stable.  No significant increased sob.  No vomiting.  No abdominal pain or cramping.  No bowel change, such as significant constipation, BRBPR or melana.   Has done well since her pacemaker placement.   Increased fatigue.  Appears to be some better.  See above.       Objective:   Physical Exam  Filed Vitals:   02/14/14 1201  BP: 118/62  Pulse: 86  Temp: 97.9 F (7436.586 C)   80 year old female in no acute distress.   HEENT:  Nares - clear.  OP- without lesions or erythema.  NECK:  Supple, nontender.   HEART:  Appears to be regular. LUNGS:  Without crackles or wheezing audible.  Respirations even and unlabored.  Good breath sounds bilaterally  RADIAL PULSE:  Equal bilaterally.  ABDOMEN:  Soft, nontender.     EXTREMITIES:  No increased edema to be present.  Stable.  No increased erythema.  Stasis changes.  Stable.             Assessment & Plan:  1. Essential hypertension Blood pressure doing well.  Follow.    2. Chronic obstructive pulmonary disease, unspecified COPD, unspecified chronic bronchitis type Breathing stable.  Continue inhalers.  Continue f/u with Dr Meredeth IdeFleming.    3. Tachy-brady syndrome Sees cardiology.  Has done well since pacemaker placement.    4. B12 deficiency Continue B12 injections.    5. Hypercholesterolemia Low cholesterol diet.    6. Hyperglycemia Low carb diet.  Follow.    7.  Other fatigue May be multifactorial.  Overall stable.  Follow.   HEALTH MAINTENANCE.  Has declined mammograms and further colon evaluation or any further testing.  Declines bone density.  Was agreeable for mammogram.  Scheduled.  Appears to have not had.     I spent 25 minutes with the patient and more than 50% of the time was spent in consultation regarding the above.

## 2014-03-13 ENCOUNTER — Ambulatory Visit (INDEPENDENT_AMBULATORY_CARE_PROVIDER_SITE_OTHER): Payer: Commercial Managed Care - HMO | Admitting: *Deleted

## 2014-03-13 ENCOUNTER — Ambulatory Visit: Payer: Commercial Managed Care - HMO

## 2014-03-13 DIAGNOSIS — E538 Deficiency of other specified B group vitamins: Secondary | ICD-10-CM

## 2014-03-13 MED ORDER — CYANOCOBALAMIN 1000 MCG/ML IJ SOLN
1000.0000 ug | Freq: Once | INTRAMUSCULAR | Status: AC
  Administered 2014-03-13: 1000 ug via INTRAMUSCULAR

## 2014-03-20 ENCOUNTER — Encounter: Payer: Self-pay | Admitting: Internal Medicine

## 2014-03-21 ENCOUNTER — Encounter: Payer: Self-pay | Admitting: Internal Medicine

## 2014-03-29 ENCOUNTER — Ambulatory Visit: Payer: Commercial Managed Care - HMO | Admitting: Internal Medicine

## 2014-03-29 ENCOUNTER — Ambulatory Visit (INDEPENDENT_AMBULATORY_CARE_PROVIDER_SITE_OTHER): Payer: Commercial Managed Care - HMO | Admitting: Internal Medicine

## 2014-03-29 ENCOUNTER — Encounter: Payer: Self-pay | Admitting: Internal Medicine

## 2014-03-29 VITALS — BP 172/89 | HR 87 | Temp 97.8°F | Ht 61.0 in | Wt 126.8 lb

## 2014-03-29 DIAGNOSIS — E7800 Pure hypercholesterolemia, unspecified: Secondary | ICD-10-CM

## 2014-03-29 DIAGNOSIS — E538 Deficiency of other specified B group vitamins: Secondary | ICD-10-CM

## 2014-03-29 DIAGNOSIS — I495 Sick sinus syndrome: Secondary | ICD-10-CM

## 2014-03-29 DIAGNOSIS — R739 Hyperglycemia, unspecified: Secondary | ICD-10-CM

## 2014-03-29 DIAGNOSIS — J449 Chronic obstructive pulmonary disease, unspecified: Secondary | ICD-10-CM

## 2014-03-29 DIAGNOSIS — I10 Essential (primary) hypertension: Secondary | ICD-10-CM

## 2014-03-29 DIAGNOSIS — N3000 Acute cystitis without hematuria: Secondary | ICD-10-CM

## 2014-03-29 NOTE — Progress Notes (Signed)
Pre visit review using our clinic review tool, if applicable. No additional management support is needed unless otherwise documented below in the visit note. 

## 2014-04-01 ENCOUNTER — Encounter: Payer: Self-pay | Admitting: Internal Medicine

## 2014-04-01 DIAGNOSIS — N390 Urinary tract infection, site not specified: Secondary | ICD-10-CM | POA: Insufficient documentation

## 2014-04-01 NOTE — Progress Notes (Signed)
Subjective:    Patient ID: Christine Bowen, female    DOB: 1922-07-02, 81 y.o.   MRN: [id number]  HPI 81 year old female with past histroy of hypertension, emphysema/asthma and hypercholesterolemia who comes in today for a scheduled follow up.  She is accompanied by her daughter.  History obtained from both of them.  She feels her breathing is overall stable.  Using her inhalers.  Seeing Dr Raul Del.  No chest pain or tightness.  Has done well s/p pacemaker placement.  Had placed 10/26/12.   Sees Dr Ubaldo Glassing.  No acid reflux.  Eating and drinking well.  No nausea or vomiting.  No bowel change.  She is receiving B12 injections now.  Was recently concerned about mood change.  Found to have UTI.  Currently on abx.  Feels good.  Daughter states she feels she is doing better.  Does report hearing needs to be reevaluated.  Also needs ear wax removed.       Past Medical History  Diagnosis Date  . COPD (chronic obstructive pulmonary disease)   . Hypertension   . Hypercholesterolemia   . Diverticulosis   . Hyperglycemia     Current Outpatient Prescriptions on File Prior to Visit  Medication Sig Dispense Refill  . alum & mag hydroxide-simeth (MAALOX PLUS) 400-400-40 MG/5ML suspension Take 30 mLs by mouth every 6 (six) hours as needed for indigestion.    Marland Kitchen aspirin 325 MG tablet Take 325 mg by mouth daily.    . Fluticasone-Salmeterol (ADVAIR DISKUS) 250-50 MCG/DOSE AEPB Inhale 1 puff into the lungs every 12 (twelve) hours.    . magnesium hydroxide (MILK OF MAGNESIA) 400 MG/5ML suspension Take 30 mLs by mouth at bedtime as needed for mild constipation.    . metoprolol tartrate (LOPRESSOR) 25 MG tablet Take 25 mg by mouth daily.    . ranitidine (ZANTAC) 150 MG tablet Take 150 mg by mouth daily.    . sertraline (ZOLOFT) 25 MG tablet Take 25 mg by mouth daily.    Marland Kitchen tiotropium (SPIRIVA) 18 MCG inhalation capsule Place 18 mcg into inhaler and inhale daily.    Marland Kitchen tobramycin (TOBREX) 0.3 % ophthalmic ointment Place  1 application into the right eye daily.     No current facility-administered medications on file prior to visit.    Review of Systems Patient denies any headache, lightheadedness or dizziness.  No significant sinus or allergy symptoms.  No chest pain, tightness or palpitations.  No increased cough or chest congestion.  She feels her breathing is stable.  No significant increased sob.  No vomiting.  No abdominal pain or cramping.  No bowel change, such as significant constipation, BRBPR or melana.   Has done well since her pacemaker placement.  Being treated for uti.  Hearing needs evaluation as outlined.         Objective:   Physical Exam  Filed Vitals:   03/29/14 1315  BP: 172/89  Pulse: 87  Temp: 97.8 F (36.6 C)   Blood pressure recheck:  74/23  81 year old female in no acute distress.   HEENT:  Nares - clear.  OP- without lesions or erythema.  NECK:  Supple, nontender.   HEART:  Appears to be regular. LUNGS:  Without crackles or wheezing audible.  Respirations even and unlabored.  Good breath sounds bilaterally  RADIAL PULSE:  Equal bilaterally.  ABDOMEN:  Soft, nontender.     EXTREMITIES:  No increased edema to be present.  Stable.  No increased erythema.  Stasis changes.  Stable.             Assessment & Plan:  1. Essential hypertension Blood pressure doing well.  Same medication regimen.  Follow pressures.  Follow met b.    2. Tachy-brady syndrome S/p pacemaker placement.  Continue f/u with Dr Ubaldo Glassing.    3. Chronic obstructive pulmonary disease, unspecified COPD, unspecified chronic bronchitis type Breathing stable.  Using inhalers regularly.  Continue to f/u with Dr Raul Del.    4. B12 deficiency Continue B12 injections.    5. Hypercholesterolemia Low cholesterol diet.  Follow lipid panel.    6. Hyperglycemia Low carb diet.  Follow a1c.    7. Acute cystitis without hematuria Being treated for uti.  Doing well.    HEALTH MAINTENANCE.  Has declined mammograms  and further colon evaluation or any further testing.  Declines bone density.  Was agreeable for mammogram.  Scheduled.  Appears to have not had.     I spent 25 minutes with the patient and more than 50% of the time was spent in consultation regarding the above.

## 2014-04-11 ENCOUNTER — Encounter: Payer: Self-pay | Admitting: Internal Medicine

## 2014-04-11 DIAGNOSIS — H6120 Impacted cerumen, unspecified ear: Secondary | ICD-10-CM

## 2014-04-11 DIAGNOSIS — H9190 Unspecified hearing loss, unspecified ear: Secondary | ICD-10-CM

## 2014-04-11 NOTE — Telephone Encounter (Signed)
No referral in chart

## 2014-04-11 NOTE — Telephone Encounter (Signed)
Order place for ENT referral.

## 2014-04-17 ENCOUNTER — Ambulatory Visit (INDEPENDENT_AMBULATORY_CARE_PROVIDER_SITE_OTHER): Payer: Commercial Managed Care - HMO | Admitting: *Deleted

## 2014-04-17 DIAGNOSIS — E538 Deficiency of other specified B group vitamins: Secondary | ICD-10-CM

## 2014-04-17 MED ORDER — CYANOCOBALAMIN 1000 MCG/ML IJ SOLN
1000.0000 ug | Freq: Once | INTRAMUSCULAR | Status: AC
  Administered 2014-04-17: 1000 ug via INTRAMUSCULAR

## 2014-05-07 ENCOUNTER — Encounter: Payer: Self-pay | Admitting: Internal Medicine

## 2014-05-08 ENCOUNTER — Encounter: Payer: Self-pay | Admitting: Internal Medicine

## 2014-05-14 ENCOUNTER — Encounter: Payer: Self-pay | Admitting: *Deleted

## 2014-05-15 ENCOUNTER — Telehealth: Payer: Self-pay | Admitting: *Deleted

## 2014-05-15 NOTE — Telephone Encounter (Signed)
error 

## 2014-05-16 ENCOUNTER — Inpatient Hospital Stay: Payer: Self-pay | Admitting: Internal Medicine

## 2014-05-17 ENCOUNTER — Encounter: Payer: Self-pay | Admitting: Internal Medicine

## 2014-05-23 ENCOUNTER — Emergency Department: Payer: Self-pay | Admitting: Emergency Medicine

## 2014-05-24 ENCOUNTER — Telehealth: Payer: Self-pay | Admitting: Internal Medicine

## 2014-05-24 NOTE — Telephone Encounter (Signed)
Daughter notified of appt 

## 2014-05-24 NOTE — Telephone Encounter (Signed)
Confirm with [name] (pts daughter) that pt is doing ok.  I can see her next Thursday at 10:00 (30min) - for hospital f/u.

## 2014-05-24 NOTE — Telephone Encounter (Signed)
The patient was released from the hospital on 2.21.16. The patient is needing a hospital follow up appointment . Please advise.

## 2014-05-28 ENCOUNTER — Encounter: Payer: Self-pay | Admitting: Internal Medicine

## 2014-05-28 ENCOUNTER — Telehealth: Payer: Self-pay | Admitting: *Deleted

## 2014-05-28 NOTE — Telephone Encounter (Signed)
Representative from [facility] called requesting status of Christine Bowen.  Please advise

## 2014-05-28 NOTE — Telephone Encounter (Signed)
Duplicate.  See attached message.   

## 2014-05-28 NOTE — Telephone Encounter (Signed)
Spoke with Roxanna at [facility], advised of MDs message.  Verbalized understanding

## 2014-05-28 NOTE — Telephone Encounter (Signed)
The form they sent required an examination.  She has an appt with me this week.  Was going to complete after exam at appt.

## 2014-05-31 ENCOUNTER — Ambulatory Visit (INDEPENDENT_AMBULATORY_CARE_PROVIDER_SITE_OTHER): Payer: Commercial Managed Care - HMO | Admitting: Internal Medicine

## 2014-05-31 ENCOUNTER — Encounter: Payer: Self-pay | Admitting: Internal Medicine

## 2014-05-31 VITALS — BP 130/70 | HR 75 | Temp 97.4°F | Ht 61.0 in | Wt 127.2 lb

## 2014-05-31 DIAGNOSIS — I4891 Unspecified atrial fibrillation: Secondary | ICD-10-CM

## 2014-05-31 DIAGNOSIS — E538 Deficiency of other specified B group vitamins: Secondary | ICD-10-CM

## 2014-05-31 DIAGNOSIS — I10 Essential (primary) hypertension: Secondary | ICD-10-CM | POA: Diagnosis not present

## 2014-05-31 DIAGNOSIS — R739 Hyperglycemia, unspecified: Secondary | ICD-10-CM

## 2014-05-31 DIAGNOSIS — J449 Chronic obstructive pulmonary disease, unspecified: Secondary | ICD-10-CM | POA: Diagnosis not present

## 2014-05-31 DIAGNOSIS — R5383 Other fatigue: Secondary | ICD-10-CM

## 2014-05-31 MED ORDER — CYANOCOBALAMIN 1000 MCG/ML IJ SOLN
1000.0000 ug | Freq: Once | INTRAMUSCULAR | Status: AC
  Administered 2014-05-31: 1000 ug via INTRAMUSCULAR

## 2014-05-31 NOTE — Progress Notes (Signed)
Pre visit review using our clinic review tool, if applicable. No additional management support is needed unless otherwise documented below in the visit note. 

## 2014-06-03 ENCOUNTER — Encounter: Payer: Self-pay | Admitting: Internal Medicine

## 2014-06-03 ENCOUNTER — Telehealth: Payer: Self-pay | Admitting: Family Medicine

## 2014-06-03 DIAGNOSIS — I4891 Unspecified atrial fibrillation: Secondary | ICD-10-CM | POA: Insufficient documentation

## 2014-06-03 NOTE — Progress Notes (Signed)
Patient ID: Christine Bowen, female   DOB: 07-Apr-1922, 81 y.o.   MRN: [id number]   Subjective:    Patient ID: Christine Bowen, female    DOB: 09-Aug-1922, 81 y.o.   MRN: [id number]  HPI  Patient here for a hospital follow up.  Was admitted 05/16/14 - 05/20/14 - with pneumonia, COPD and atrial fibrillation.  See hospital discharge note for details.  Was initially more hypoxic- requiring BiPAP.  Her oxygen was weaned back down to 3L [place] - her baseline - prior to discharge.  Treated with vancomycin, zosyn and azithromycin in the hospital.  She was also treated with IV steroids.   Maintained on augmentin after discharge.  She comes in today accompanied by her daughter.  History obtained from both of them.  States her breathing is back to baseline.  Appetite is improving.  Still weak.  Needs her walker to ambulate.  Her walker is approximately 79 years old.  Needs a new one.  Bowels stable.     Past Medical History  Diagnosis Date  . COPD (chronic obstructive pulmonary disease)   . Hypertension   . Hypercholesterolemia   . Diverticulosis   . Hyperglycemia     Current Outpatient Prescriptions on File Prior to Visit  Medication Sig Dispense Refill  . alum & mag hydroxide-simeth (MAALOX PLUS) 400-400-40 MG/5ML suspension Take 30 mLs by mouth every 6 (six) hours as needed for indigestion.    Marland Kitchen amoxicillin-clavulanate (AUGMENTIN) 500-125 MG per tablet Take by mouth 2 (two) times daily.    Marland Kitchen aspirin 325 MG tablet Take 325 mg by mouth daily.    . Fluticasone-Salmeterol (ADVAIR DISKUS) 250-50 MCG/DOSE AEPB Inhale 1 puff into the lungs every 12 (twelve) hours.    . magnesium hydroxide (MILK OF MAGNESIA) 400 MG/5ML suspension Take 30 mLs by mouth at bedtime as needed for mild constipation.    . metoprolol tartrate (LOPRESSOR) 25 MG tablet Take 25 mg by mouth daily.    . ranitidine (ZANTAC) 150 MG tablet Take 150 mg by mouth daily.    . sertraline (ZOLOFT) 25 MG tablet Take 25 mg by mouth daily.    Marland Kitchen  tiotropium (SPIRIVA) 18 MCG inhalation capsule Place 18 mcg into inhaler and inhale daily.    Marland Kitchen tobramycin (TOBREX) 0.3 % ophthalmic ointment Place 1 application into the right eye daily.     No current facility-administered medications on file prior to visit.    Review of Systems  Constitutional: Negative for unexpected weight change.       Appetite improved.  Eating better.    HENT: Negative for congestion and sinus pressure.   Respiratory: Negative for cough, chest tightness and shortness of breath.        Breathing improved.    Cardiovascular: Negative for chest pain, palpitations and leg swelling.  Gastrointestinal: Negative for nausea, vomiting, abdominal pain and diarrhea.  Genitourinary: Negative for dysuria and difficulty urinating.  Musculoskeletal: Negative for back pain and joint swelling.  Neurological: Negative for dizziness and headaches.       Objective:    Physical Exam  Constitutional: She appears well-developed and well-nourished. No distress.  HENT:  Nose: Nose normal.  Mouth/Throat: Oropharynx is clear and moist.  Neck: Neck supple. No thyromegaly present.  Cardiovascular: Normal rate.   Pulmonary/Chest: Breath sounds normal. No respiratory distress. She has no wheezes.  Abdominal: Soft. Bowel sounds are normal. There is no tenderness.  Musculoskeletal: She exhibits no edema or tenderness.  Lymphadenopathy:    She  has no cervical adenopathy.  Skin: No rash noted.  Venostasis changes - lower extremity.     BP 130/70 mmHg  Pulse 75  Temp(Src) 97.4 F (36.3 C) (Oral)  Ht _0  (1.549 m)  Wt 127 lb 4 oz (57.72 kg)  BMI 24.06 kg/m2  SpO2 95% Wt Readings from Last 3 Encounters:  05/31/14 127 lb 4 oz (57.72 kg)  03/29/14 126 lb 12 oz (57.493 kg)  02/14/14 126 lb 12 oz (57.493 kg)     Lab Results  Component Value Date   WBC 10.1 08/22/2013   HGB 14.7 08/22/2013   HCT 44.2 08/22/2013   PLT 359.0 08/22/2013   GLUCOSE 87 08/31/2013   CHOL 208*  06/20/2013   TRIG 92.0 06/20/2013   HDL 52.40 06/20/2013   LDLCALC 137* 06/20/2013   ALT 14 08/22/2013   AST 16 08/22/2013   NA 140 08/31/2013   K 3.9 08/31/2013   CL 103 08/31/2013   CREATININE 1.0 08/31/2013   BUN 13 08/31/2013   CO2 31 08/31/2013   TSH 5.44 06/20/2013   HGBA1C 5.4 08/17/2012       Assessment & Plan:   Problem List Items Addressed This Visit    Atrial fibrillation    Sees cardiology.  Has pacemaker.  Needs referral to Dr Ubaldo Glassing.        B12 deficiency - Primary    Continue B12 injections.        Relevant Medications   cyanocobalamin ((VITAMIN B-12)) injection 1,000 mcg (Completed)   COPD (chronic obstructive pulmonary disease)    Recent hospitalization and treatment for pneumonia.  Breathing back to baseline.  On oxygen.        Fatigue    Weakness as outlined.  Needs a walker to ambulate.  Her walker is 79 years old.  Needs a new one.  Form completed.        Hyperglycemia    Follow met b and a1c.        Hypertension    Blood pressure doing well.  Same medication regimen.  Follow pressures.          I spent 25 minutes with the patient and more than 50% of the time was spent in consultation regarding the above.     Einar Pheasant, MD

## 2014-06-03 NOTE — Assessment & Plan Note (Signed)
Weakness as outlined.  Needs a walker to ambulate.  Her walker is 79 years old.  Needs a new one.  Form completed.

## 2014-06-03 NOTE — Assessment & Plan Note (Signed)
Continue B12 injections.   

## 2014-06-03 NOTE — Assessment & Plan Note (Signed)
Sees cardiology.  Has pacemaker.  Needs referral to Dr Lady GaryFath.

## 2014-06-03 NOTE — Telephone Encounter (Signed)
[name], [address] [facility], her BP right before dinner of 142/100. Daughter is concerned. Resident is now at dinner and acting normal. She is nonverbal and there is some concern that she could have pain. They are given an order to give her 2 Tylenol 325mg  tabs and then to recheck her BP in 1 hour. Call back if BP> 145/100. Notify doctor's office with numbers in am otherwise

## 2014-06-03 NOTE — Assessment & Plan Note (Signed)
Recent hospitalization and treatment for pneumonia.  Breathing back to baseline.  On oxygen.

## 2014-06-03 NOTE — Addendum Note (Signed)
Addended by: Charm BargesSCOTT, Brigida Scotti S on: 06/03/2014 11:46 PM   Modules accepted: Orders

## 2014-06-03 NOTE — Assessment & Plan Note (Signed)
Follow met b and a1c.

## 2014-06-03 NOTE — Assessment & Plan Note (Signed)
Blood pressure doing well.  Same medication regimen.  Follow pressures.   

## 2014-06-04 ENCOUNTER — Encounter: Payer: Self-pay | Admitting: *Deleted

## 2014-06-04 NOTE — Telephone Encounter (Signed)
No answer, sent my chart message.

## 2014-06-04 NOTE — Telephone Encounter (Signed)
Received notification regarding Christine Bowen's blood pressure.  Please call and confirm she is doing ok and check on blood pressures.  Thanks.

## 2014-06-05 NOTE — Telephone Encounter (Signed)
Spoke with Christine Bowen & states that Christine Bowen has been in a better mood since she has gotten out of the hospital. Hasn't heard anything about her BP since over the weekend.

## 2014-06-28 ENCOUNTER — Encounter: Payer: Self-pay | Admitting: Internal Medicine

## 2014-06-28 ENCOUNTER — Ambulatory Visit (INDEPENDENT_AMBULATORY_CARE_PROVIDER_SITE_OTHER): Payer: Commercial Managed Care - HMO | Admitting: Internal Medicine

## 2014-06-28 VITALS — BP 122/78 | HR 80 | Temp 97.8°F | Resp 14 | Ht 61.0 in | Wt 123.8 lb

## 2014-06-28 DIAGNOSIS — I10 Essential (primary) hypertension: Secondary | ICD-10-CM

## 2014-06-28 DIAGNOSIS — R739 Hyperglycemia, unspecified: Secondary | ICD-10-CM

## 2014-06-28 DIAGNOSIS — J449 Chronic obstructive pulmonary disease, unspecified: Secondary | ICD-10-CM

## 2014-06-28 DIAGNOSIS — R5383 Other fatigue: Secondary | ICD-10-CM

## 2014-06-28 DIAGNOSIS — I4891 Unspecified atrial fibrillation: Secondary | ICD-10-CM

## 2014-06-28 DIAGNOSIS — E538 Deficiency of other specified B group vitamins: Secondary | ICD-10-CM

## 2014-06-28 DIAGNOSIS — N3000 Acute cystitis without hematuria: Secondary | ICD-10-CM

## 2014-06-28 DIAGNOSIS — E7800 Pure hypercholesterolemia, unspecified: Secondary | ICD-10-CM

## 2014-06-28 DIAGNOSIS — N390 Urinary tract infection, site not specified: Secondary | ICD-10-CM

## 2014-06-28 MED ORDER — CEPHALEXIN 500 MG PO CAPS: 500.0000 mg | ORAL_CAPSULE | Freq: Three times a day (TID) | ORAL | Status: DC

## 2014-06-28 NOTE — Progress Notes (Signed)
Pre visit review using our clinic review tool, if applicable. No additional management support is needed unless otherwise documented below in the visit note. 

## 2014-06-29 LAB — BASIC METABOLIC PANEL
BUN: 19 mg/dL (ref 6–23)
CO2: 36 mEq/L — ABNORMAL HIGH (ref 19–32)
CREATININE: 0.9 mg/dL (ref 0.40–1.20)
Calcium: 9.6 mg/dL (ref 8.4–10.5)
Chloride: 101 mEq/L (ref 96–112)
GFR: 62.58 mL/min (ref >60.00)
GLUCOSE: 93 mg/dL (ref 70–99)
Potassium: 3.9 mEq/L (ref 3.5–5.1)
Sodium: 142 mEq/L (ref 135–145)

## 2014-06-29 LAB — CBC WITH DIFFERENTIAL/PLATELET
BASOS PCT: 0.4 % (ref 0.0–3.0)
Basophils Absolute: 0 10*3/uL (ref 0.0–0.1)
EOS ABS: 0.1 10*3/uL (ref 0.0–0.7)
Eosinophils Relative: 1 % (ref 0.0–5.0)
HEMATOCRIT: 41.2 % (ref 36.0–46.0)
HEMOGLOBIN: 13.9 g/dL (ref 12.0–15.0)
LYMPHS PCT: 22.6 % (ref 12.0–46.0)
Lymphs Abs: 2.3 10*3/uL (ref 0.7–4.0)
MCHC: 33.7 g/dL (ref 30.0–36.0)
MCV: 86.3 fl (ref 78.0–100.0)
Monocytes Absolute: 0.5 10*3/uL (ref 0.1–1.0)
Monocytes Relative: 5.4 % (ref 3.0–12.0)
NEUTROS ABS: 7.1 10*3/uL (ref 1.4–7.7)
Neutrophils Relative %: 70.6 % (ref 43.0–77.0)
Platelets: 467 10*3/uL — ABNORMAL HIGH (ref 150.0–400.0)
RBC: 4.77 Mil/uL (ref 3.87–5.11)
RDW: 13.8 % (ref 11.5–15.5)
WBC: 10.1 10*3/uL (ref 4.0–10.5)

## 2014-06-30 ENCOUNTER — Encounter: Payer: Self-pay | Admitting: Internal Medicine

## 2014-07-01 ENCOUNTER — Encounter: Payer: Self-pay | Admitting: Internal Medicine

## 2014-07-01 NOTE — Assessment & Plan Note (Signed)
Blood pressure under good control.  Follow pressures.  Follow metabolic panel.   

## 2014-07-01 NOTE — Assessment & Plan Note (Signed)
Check routine labs.   

## 2014-07-01 NOTE — Assessment & Plan Note (Signed)
Continue B12 injections.   

## 2014-07-01 NOTE — Assessment & Plan Note (Signed)
Low carb diet and exercise.  Follow met b and a1c.  

## 2014-07-01 NOTE — Assessment & Plan Note (Signed)
Breathing stable.  Has oxygen.  Follow.

## 2014-07-01 NOTE — Assessment & Plan Note (Signed)
Sees cardiology.  Has pacemaker.  Sees Dr Lady GaryFath.

## 2014-07-01 NOTE — Assessment & Plan Note (Signed)
Low cholesterol diet and exercise.  Follow lipid panel.   

## 2014-07-01 NOTE — Progress Notes (Signed)
Patient ID: Christine Bowen, female   DOB: [birth year], 81 y.o.   MRN: [id number]   Subjective:    Patient ID: Christine Bowen, female    DOB: [birth year], 81 y.o.   MRN: [id number]  HPI  Patient here for a scheduled follow up.  She is accompanied by her daughter.  History obtained from both of them.  Recent urine checked because of personality change.   Found to have another uti.  Needs treatment.  We discussed reoccurring infections.  Discussed referral to urology for evaluation.  Pt and daughter agreeable.  Breathing stable.  No increased cough or congestion.     Past Medical History  Diagnosis Date  . COPD (chronic obstructive pulmonary disease)   . Hypertension   . Hypercholesterolemia   . Diverticulosis   . Hyperglycemia     Current Outpatient Prescriptions on File Prior to Visit  Medication Sig Dispense Refill  . aspirin 325 MG tablet Take 325 mg by mouth daily.    . Fluticasone-Salmeterol (ADVAIR DISKUS) 250-50 MCG/DOSE AEPB Inhale 1 puff into the lungs every 12 (twelve) hours.    . magnesium hydroxide (MILK OF MAGNESIA) 400 MG/5ML suspension Take 30 mLs by mouth at bedtime as needed for mild constipation.    . metoprolol tartrate (LOPRESSOR) 25 MG tablet Take 25 mg by mouth daily.    . ranitidine (ZANTAC) 150 MG tablet Take 150 mg by mouth daily.    . sertraline (ZOLOFT) 25 MG tablet Take 25 mg by mouth daily.    Marland Kitchen tiotropium (SPIRIVA) 18 MCG inhalation capsule Place 18 mcg into inhaler and inhale daily.     No current facility-administered medications on file prior to visit.    Review of Systems  Constitutional: Negative for appetite change (eating better. ) and unexpected weight change.  HENT: Negative for congestion and sinus pressure.   Respiratory: Negative for cough, chest tightness and shortness of breath.   Cardiovascular: Negative for chest pain, palpitations and leg swelling.  Gastrointestinal: Negative for nausea, vomiting, abdominal pain and diarrhea.  Skin:  Negative for rash.  Neurological: Negative for dizziness, light-headedness and headaches.  Psychiatric/Behavioral:       Daughter reported some increased agitation recently - with concern over uti.         Objective:    Physical Exam  Constitutional: No distress.  HENT:  Nose: Nose normal.  Mouth/Throat: Oropharynx is clear and moist.  Neck: Neck supple. No thyromegaly present.  Cardiovascular: Normal rate and regular rhythm.   Pulmonary/Chest: Breath sounds normal. No respiratory distress. She has no wheezes.  Abdominal: Soft. Bowel sounds are normal. There is no tenderness.  Musculoskeletal: She exhibits no edema.  Stasis changes.    Lymphadenopathy:    She has no cervical adenopathy.  Skin: No rash noted. No erythema.    BP 122/78 mmHg  Pulse 80  Temp(Src) 97.8 F (36.6 C) (Oral)  Resp 14  Ht _0  (1.549 m)  Wt 123 lb 12.8 oz (56.155 kg)  BMI 23.40 kg/m2  SpO2 97% Wt Readings from Last 3 Encounters:  06/28/14 123 lb 12.8 oz (56.155 kg)  05/31/14 127 lb 4 oz (57.72 kg)  03/29/14 126 lb 12 oz (57.493 kg)     Lab Results  Component Value Date   WBC 10.1 06/28/2014   HGB 13.9 06/28/2014   HCT 41.2 06/28/2014   PLT 467.0* 06/28/2014   GLUCOSE 93 06/28/2014   CHOL 208* 06/20/2013   TRIG 92.0 06/20/2013   HDL 52.40 06/20/2013  LDLCALC 137* 06/20/2013   ALT 14 08/22/2013   AST 16 08/22/2013   NA 142 06/28/2014   K 3.9 06/28/2014   CL 101 06/28/2014   CREATININE 0.90 06/28/2014   BUN 19 06/28/2014   CO2 36* 06/28/2014   TSH 5.44 06/20/2013   HGBA1C 5.4 08/17/2012       Assessment & Plan:   Problem List Items Addressed This Visit    Atrial fibrillation    Sees cardiology.  Has pacemaker.  Sees Dr Ubaldo Glassing.       B12 deficiency    Continue B12 injections.        COPD (chronic obstructive pulmonary disease)    Breathing stable.  Has oxygen.  Follow.        Relevant Medications   VENTOLIN HFA 108 (90 BASE) MCG/ACT inhaler   guaifenesin  (ROBITUSSIN) 100 MG/5ML syrup   Fatigue - Primary    Check routine labs.       Relevant Orders   CBC with Differential/Platelet (Completed)   Basic metabolic panel (Completed)   Hypercholesterolemia    Low cholesterol diet and exercise.  Follow lipid panel.        Hyperglycemia    Low carb diet and exercise.  Follow met b and a1c.       Hypertension    Blood pressure under good control.  Follow pressures.  Follow metabolic panel.        UTI (urinary tract infection)    Recent UTI.  Treat with keflex.  Refer to urology for further evaluation - given reoccurring infections.        Relevant Medications   cephALEXin (KEFLEX) capsule    Other Visit Diagnoses    Frequent UTI        Relevant Medications    cephALEXin (KEFLEX) capsule    Other Relevant Orders    Ambulatory referral to Urology      I spent 25 minutes with the patient and more than 50% of the time was spent in consultation regarding the above.     Einar Pheasant, MD

## 2014-07-01 NOTE — Assessment & Plan Note (Signed)
Recent UTI.  Treat with keflex.  Refer to urology for further evaluation - given reoccurring infections.

## 2014-07-03 ENCOUNTER — Ambulatory Visit (INDEPENDENT_AMBULATORY_CARE_PROVIDER_SITE_OTHER): Payer: Commercial Managed Care - HMO | Admitting: *Deleted

## 2014-07-03 DIAGNOSIS — E538 Deficiency of other specified B group vitamins: Secondary | ICD-10-CM

## 2014-07-03 MED ORDER — CYANOCOBALAMIN 1000 MCG/ML IJ SOLN
1000.0000 ug | Freq: Once | INTRAMUSCULAR | Status: AC
  Administered 2014-07-03: 1000 ug via INTRAMUSCULAR

## 2014-07-12 ENCOUNTER — Emergency Department
Admit: 2014-07-12 | Disposition: A | Discharge status: Home / Self Care | Payer: Self-pay | Admitting: Emergency Medicine

## 2014-07-12 LAB — CBC WITH DIFFERENTIAL/PLATELET
BASOS PCT: 0.7 %
Basophil #: 0.1 10*3/uL (ref 0.0–0.1)
EOS PCT: 1 %
Eosinophil #: 0.2 10*3/uL (ref 0.0–0.7)
HCT: 41.6 % (ref 35.0–47.0)
HGB: 13.7 g/dL (ref 12.0–16.0)
LYMPHS PCT: 20.3 %
Lymphocyte #: 3.5 10*3/uL (ref 1.0–3.6)
MCH: 29 pg (ref 26.0–34.0)
MCHC: 32.8 g/dL (ref 32.0–36.0)
MCV: 88 fL (ref 80–100)
MONOS PCT: 7 %
Monocyte #: 1.2 x10 3/mm — ABNORMAL HIGH (ref 0.2–0.9)
NEUTROS PCT: 71 %
Neutrophil #: 12.3 10*3/uL — ABNORMAL HIGH (ref 1.4–6.5)
PLATELETS: 311 10*3/uL (ref 150–440)
RBC: 4.71 10*6/uL (ref 3.80–5.20)
RDW: 13.9 % (ref 11.5–14.5)
WBC: 17.4 10*3/uL — AB (ref 3.6–11.0)

## 2014-07-12 LAB — BASIC METABOLIC PANEL
ANION GAP: 9 (ref 7–16)
BUN: 19 mg/dL
Calcium, Total: 9.1 mg/dL
Chloride: 101 mmol/L
Co2: 28 mmol/L
Creatinine: 0.97 mg/dL
EGFR (African American): >60
EGFR (Non-African Amer.): 52 — ABNORMAL LOW
GLUCOSE: 202 mg/dL — AB
Potassium: 3.5 mmol/L
Sodium: 138 mmol/L

## 2014-07-12 LAB — TROPONIN I

## 2014-07-13 ENCOUNTER — Telehealth: Payer: Self-pay | Admitting: *Deleted

## 2014-07-13 ENCOUNTER — Telehealth: Payer: Self-pay

## 2014-07-13 LAB — URINALYSIS, COMPLETE
Bacteria: NONE SEEN
Bilirubin,UR: NEGATIVE
Blood: NEGATIVE
Glucose,UR: NEGATIVE mg/dL (ref 0–75)
KETONE: NEGATIVE
Leukocyte Esterase: NEGATIVE
Nitrite: NEGATIVE
PH: 7 (ref 4.5–8.0)
PROTEIN: NEGATIVE
SPECIFIC GRAVITY: 1.011 (ref 1.003–1.030)

## 2014-07-13 NOTE — Telephone Encounter (Signed)
Received a voicemail stating that patient was vomiting & not feeling well today. BP: 94/73, Pulse: 85, Temp: 97.9. Pt was [facility] to [facility] by family.

## 2014-07-13 NOTE — Telephone Encounter (Signed)
Need to know if pt injured in today's fall.  Also, need hospital/ER records.  Need to know how she is doing now?  If persistent dizziness and is vertigo - may need ENT evaluation if agreeable.

## 2014-07-13 NOTE — Telephone Encounter (Signed)
Christine Bowen at [facility] called to inform Dr. Lorin PicketScott that the patient fell yesterday 4/14 and was transported to [facility], diagnosed with Vertigo.  Patient again fell this am and patient and POA refused to have her transported to Hospital.  Please advise for any changes?

## 2014-07-13 NOTE — Telephone Encounter (Signed)
Agree with transport to ER.  Thanks

## 2014-07-13 NOTE — Telephone Encounter (Signed)
ER records requested also

## 2014-07-13 NOTE — Telephone Encounter (Signed)
Opened in error see other phone note 

## 2014-07-16 NOTE — Telephone Encounter (Signed)
Records placed in green folder 

## 2014-07-17 ENCOUNTER — Telehealth: Payer: Self-pay | Admitting: Internal Medicine

## 2014-07-17 NOTE — Telephone Encounter (Signed)
Received ER records from 07/12/14.  Per phone note, she was also sent back to ER 07/13/14.  Need ER/hospital records from 07/13/14 visit.   Thanks.

## 2014-07-17 NOTE — Telephone Encounter (Signed)
ER records from 07/13/14 requested

## 2014-07-18 ENCOUNTER — Inpatient Hospital Stay
Admit: 2014-07-18 | Disposition: A | Discharge status: Home / Self Care | Payer: Self-pay | Attending: Internal Medicine | Admitting: Internal Medicine

## 2014-07-18 LAB — BASIC METABOLIC PANEL
Anion Gap: 11 (ref 7–16)
BUN: 10 mg/dL
CHLORIDE: 91 mmol/L — AB
Calcium, Total: 9 mg/dL
Co2: 33 mmol/L — ABNORMAL HIGH
Creatinine: 0.79 mg/dL
EGFR (African American): >60
GLUCOSE: 154 mg/dL — AB
Potassium: 3 mmol/L — ABNORMAL LOW
Sodium: 135 mmol/L

## 2014-07-18 LAB — CK-MB
CK-MB: 3 ng/mL
CK-MB: 2.9 ng/mL

## 2014-07-18 LAB — CBC
HCT: 41.3 % (ref 35.0–47.0)
HGB: 13.8 g/dL (ref 12.0–16.0)
MCH: 29.3 pg (ref 26.0–34.0)
MCHC: 33.4 g/dL (ref 32.0–36.0)
MCV: 88 fL (ref 80–100)
Platelet: 258 10*3/uL (ref 150–440)
RBC: 4.71 10*6/uL (ref 3.80–5.20)
RDW: 13.8 % (ref 11.5–14.5)
WBC: 8.7 10*3/uL (ref 3.6–11.0)

## 2014-07-18 LAB — TROPONIN I
TROPONIN-I: 0.03 ng/mL
Troponin-I: 0.06 ng/mL — ABNORMAL HIGH
Troponin-I: <0.03 ng/mL

## 2014-07-19 LAB — TROPONIN I: Troponin-I: 0.03 ng/mL

## 2014-07-19 LAB — CK-MB: CK-MB: 2.9 ng/mL

## 2014-07-19 NOTE — Telephone Encounter (Signed)
Will review.  Thanks.  

## 2014-07-19 NOTE — Telephone Encounter (Signed)
Received Hospital records from 07/18/14. Pt was admitted (Room 118) for SOB & wheezing. Records in [place] [place]

## 2014-07-20 ENCOUNTER — Telehealth: Payer: Self-pay | Admitting: *Deleted

## 2014-07-20 NOTE — Op Note (Signed)
PATIENT NAME:  Christine Bowen, Christine Bowen MR#:  [id number] DATE OF BIRTH:  30-Mar-1923  DATE OF PROCEDURE:  10/27/2012  PRIMARY CARE PHYSICIAN: Christine Durhamharlene Scott, MD  INDICATIONS: Sick sinus syndrome.   PROCEDURE: Dual-chamber pacemaker implantation.   POSTPROCEDURE DIAGNOSIS: Atrial sensing with ventricular pacing.   INDICATION: The patient is an 81 year old female with history of paroxysmal atrial fibrillation alternating with sinus bradycardia, with recent Holter monitor revealing sinus pauses of up to 3 seconds.   DESCRIPTION OF PROCEDURE: The risks, benefits and alternatives of permanent pacemaker implantation were explained to the patient, and informed written consent was obtained. She was brought to the operating room in a fasting state. The left pectoral region was prepped and draped in the usual sterile manner. Anesthesia was obtained with 1% Xylocaine locally. A 6 cm incision was performed over the left pectoral region. Access was obtained to the left subclavian vein by fine needle aspiration. Ventricular (Medtronic [id number]) and atrial (Medtronic [id number]) leads were positioned to the interventricular septum and right atrial appendage under fluoroscopic guidance. After proper thresholds were obtained, the leads were sutured in place. The pacemaker pocket was irrigated with gentamicin solution. The leads were connected to a dual-chamber rate-responsive pacemaker generator (Medtronic Adapta [id number]) and positioned into the pocket. The pocket was closed with 2-0 and 4-0 Vicryl, respectively. Steri-Strips and pressure dressing were applied.   ____________________________ Christine MillardAlexander Reynald Woods, MD ap:OSi D: 10/27/2012 13:11:51 ET T: 10/27/2012 14:09:48 ET JOB#: [id number]  cc: Christine MillardAlexander Srihan Brutus, MD, <Dictator> Christine MillardALEXANDER Kentrell Guettler MD ELECTRONICALLY SIGNED 11/23/2012 15:59

## 2014-07-20 NOTE — Telephone Encounter (Signed)
Pt's daughter notified. [facility].

## 2014-07-20 NOTE — Telephone Encounter (Signed)
Pt unable to be seen by Lyla Sonarrie 07/24/14 due to another appt. Unable to be seen by Dr. Lorin PicketScott 07/27/14. Pt's daughter states she is only able to bring her 08/02/14 @ 4:00 (already has B12 scheduled then), after lunch on 08/03/14, or the following week. Please advise. (Anything after 08/02/14 will fall outside the 2 week visit).  Records requested from [facility].

## 2014-07-20 NOTE — Telephone Encounter (Signed)
I can see her 08/02/14 at 4:15.  May have to wait

## 2014-07-20 NOTE — Telephone Encounter (Addendum)
TCM call completed with patient's daughter, [name], per her request.   Discharge Date: 07/19/14  Transition Care Management Follow-up Telephone Call  How have you been since you were released from the hospital? Pt is doing better, just fatigued and weak, but is improving.    Do you understand why you were in the hospital? YES, acute COPD exacerbation, a-fib   Do you understand the discharge instructions? YES  Items Reviewed:  Medications reviewed: YES, discharge summary states pt was discharged home with Azithromycin and Prednisone. Pt's daughter states that pt does not do well with those medications, she told the nurse at discharge and a new abx was called in. However, she does not have the name with her. Will make sure she brings all the medications with her to her visit to verify against med list.   Allergies reviewed: YES, no new drug allergies  Dietary changes reviewed: YES, Low sodium diet  Referrals reviewed: n/a   Functional Questionnaire:   Activities of Daily Living (ADLs):  She states they are independent in the following: All, pt resides at [facility]  States they require assistance with the following: NONE   Any transportation issues/concerns?: NO, pt's daughter drives   Any patient concerns? Not at this time   Confirmed importance and date/time of follow-up visits scheduled: YES. Has appt with Dr. Meredeth IdeFleming in 2 weeks. Appt with Dr. Lady GaryFath 07/24/14. Appt was scheduled with Naomie Deanarrie Doss 07/24/14, unable to keep due to appt with Dr. Lady GaryFath. Offered appt with Dr. Lorin PicketScott 07/27/14, declined due to many appts already next week. Will discuss with Dr Lorin Picketscott and get follow up scheduled.    Confirmed with patient if condition begins to worsen call PCP or go to the ER. Patient was given the Call-a-Nurse line [phone]: YES

## 2014-07-22 NOTE — Discharge Summary (Signed)
PATIENT NAME:  Christine Bowen, [name] MR#:  [id number] DATE OF BIRTH:  02-Mar-1923  DATE OF ADMISSION:  03/21/2011 DATE OF DISCHARGE:  03/30/2011  PRIMARY CARE PHYSICIAN: Dale Durhamharlene Scott, MD    PULMONOLOGIST: Ned ClinesHerbon Fleming, MD    DISCHARGE DIAGNOSES:  1. Community-acquired pneumonia.    2. Chronic obstructive pulmonary disease exacerbation.  3. New onset atrial fibrillation. 4. Chronic obstructive pulmonary disease, oxygen dependent. 5. Hypertension.  6. Hyperlipidemia.    DISPOSITION: To [facility].  DISCHARGE MEDICATIONS:  1. Advair 250/50, 1 puff b.i.d.  2. Spiriva 18 mcg inhalation daily, 1 puff.  3. Ambien 5 mg as needed for sleep.  4. Proventil 2 inhalations every 6 hours.  New medicines:  1. Aspirin 325 mg p.o. daily.  2. Prednisone tapering 40 mg daily for 2 days, 30 mg p.o. daily for 2 days, 20 mg p.o. daily for 2 days, 10 mg p.o. daily for 2 days and then stop.  3. Augmentin 875 mg p.o. b.i.d. for 5 days. 4. Cardizem CD 120 mg p.o. daily.   CONSULTATIONS: Cardiology consult with Dr. Harold HedgeKenneth Fath for atrial fibrillation.   HOSPITAL COURSE: The patient is an 81 year old female with a history of chronic obstructive pulmonary disease-on 3 liters of oxygen at home, hypertension, hyperlipidemia, came in because of worsening cough and trouble breathing. The patient tried Z-Pak and amoxicillin, and because of worsening symptoms she came in. Look at the History and Physical for full details. When she came in, the patient was oxygenating  97% on 3.5 liters and also blood pressure was elevated at 198/73, and she was initially with heart rate of 102. Chest x-ray on admission showed chronic obstructive pulmonary disease with superimposed infiltrate in the left lower lobe, and EKG showed atrial fibrillation with RVR.   1. Pneumonia, community-acquired: The patient's blood cultures did not show any growth. She was started on Rocephin and Levaquin and continued on oxygen along with  steroids and nebulizers. Blood cultures did not show any growth. The patient's white count is elevated because of steroids, and she did not have any fever, and she has been saturating 100% on 3 liters. Clinically, she is better and she received Rocephin IV for 10 days and also Levaquin for 10 days. So, she is discharged with Augmentin to finish four more doses of antibiotics, and she can continue Spiriva, Advair and also inhalers along with tapering prednisone.  2. New onset atrial fibrillation: She started on aspirin and also Cardizem. The patient had an echo done which showed ejection fraction of more than 55% and mild-to-moderate tricuspid regurgitation. The patient is CHADS score I, so she was started on aspirin and Cardizem CD. Heart rate is running in the 50s to 60s.  Initially she was on Cardizem CD 240 mg, so I did cut it to 120 mg p.o. daily.  3. Confusion: The patient had some confusion in the hospital. At that time she was started on Risperdal, and that made her mouth very dry, and family did not want her to get Risperdal. We stopped it and her symptoms improved.  4. Mild acute renal failure and hyponatremia: The patient also developed mild acute renal failure and also hyponatremia with sodium of 127 and BUN 40, creatinine 1.30 on December 28th because of poor p.o. intake. She was started on gentle hydration along with stopping the Risperdal. After that, her mouth was less dry, and she also started to eat better. Her BUN and creatinine improved to BUN 28 and creatinine 0.90, and sodium  improved to 131.0. Right now, she is eating and tolerating the diet, and condition is stable.  5. The patient also is seen by Physical Therapy, and they wanted short-term rehabilitation. She was able to ambulate 100 feet yesterday with less loss of balance. She will be going to [facility].    TIME SPENT ON DISCHARGE PREPARATION: More than 30 minutes.  ____________________________ Katha Hamming,  MD sk:cbb D: 03/30/2011 09:16:08 ET T: 03/30/2011 11:30:45 ET JOB#: [id number] cc: Dale [place], MD Katha Hamming MD ELECTRONICALLY SIGNED 04/05/2011 22:07

## 2014-07-24 ENCOUNTER — Ambulatory Visit: Payer: Commercial Managed Care - HMO | Admitting: Nurse Practitioner

## 2014-07-26 ENCOUNTER — Telehealth: Payer: Self-pay | Admitting: Internal Medicine

## 2014-07-26 ENCOUNTER — Encounter: Payer: Self-pay | Admitting: Internal Medicine

## 2014-07-26 ENCOUNTER — Encounter: Payer: Self-pay | Admitting: *Deleted

## 2014-07-26 MED ORDER — ONDANSETRON HCL 4 MG PO TABS: ORAL_TABLET | ORAL | Status: DC

## 2014-07-26 NOTE — Telephone Encounter (Signed)
I called the Rx in to [facility], however a range of time os not acceptable so I chose for Rx to say every 10 hours instead of every 8-12 hours. The letter will have to match. I will print a corrected letter and place in folder for signature.

## 2014-07-26 NOTE — Telephone Encounter (Signed)
I accidentally closed the my chart message on Ms Christine Bowen.  Her daughter sent a message requesting rx for zofran.  I printed a prescription for zofran.  I will also print a letter to send down to [facility] - to start zofran.  Let me know if you need anything else.

## 2014-07-26 NOTE — Telephone Encounter (Signed)
Noted.  Thanks.  Will sign when return.  If need before, can see if someone else there will sign.

## 2014-07-27 NOTE — Telephone Encounter (Signed)
Order faxed.

## 2014-07-28 ENCOUNTER — Inpatient Hospital Stay
Admission: EM | Admit: 2014-07-28 | Discharge: 2014-08-01 | DRG: 189 | Disposition: A | Discharge status: Hospice | Payer: Commercial Managed Care - HMO | Attending: Internal Medicine | Admitting: Internal Medicine

## 2014-07-28 DIAGNOSIS — R739 Hyperglycemia, unspecified: Secondary | ICD-10-CM | POA: Diagnosis present

## 2014-07-28 DIAGNOSIS — Z515 Encounter for palliative care: Secondary | ICD-10-CM

## 2014-07-28 DIAGNOSIS — I4891 Unspecified atrial fibrillation: Secondary | ICD-10-CM | POA: Diagnosis present

## 2014-07-28 DIAGNOSIS — J432 Centrilobular emphysema: Secondary | ICD-10-CM | POA: Diagnosis not present

## 2014-07-28 DIAGNOSIS — Z7951 Long term (current) use of inhaled steroids: Secondary | ICD-10-CM

## 2014-07-28 DIAGNOSIS — Z7982 Long term (current) use of aspirin: Secondary | ICD-10-CM | POA: Diagnosis not present

## 2014-07-28 DIAGNOSIS — R0902 Hypoxemia: Secondary | ICD-10-CM

## 2014-07-28 DIAGNOSIS — J9621 Acute and chronic respiratory failure with hypoxia: Secondary | ICD-10-CM | POA: Diagnosis not present

## 2014-07-28 DIAGNOSIS — Z66 Do not resuscitate: Secondary | ICD-10-CM | POA: Diagnosis present

## 2014-07-28 DIAGNOSIS — Z823 Family history of stroke: Secondary | ICD-10-CM | POA: Diagnosis not present

## 2014-07-28 DIAGNOSIS — I248 Other forms of acute ischemic heart disease: Secondary | ICD-10-CM | POA: Diagnosis present

## 2014-07-28 DIAGNOSIS — E780 Pure hypercholesterolemia: Secondary | ICD-10-CM | POA: Diagnosis present

## 2014-07-28 DIAGNOSIS — Z9981 Dependence on supplemental oxygen: Secondary | ICD-10-CM

## 2014-07-28 DIAGNOSIS — J441 Chronic obstructive pulmonary disease with (acute) exacerbation: Secondary | ICD-10-CM | POA: Diagnosis present

## 2014-07-28 DIAGNOSIS — E876 Hypokalemia: Secondary | ICD-10-CM | POA: Diagnosis present

## 2014-07-28 DIAGNOSIS — R0602 Shortness of breath: Secondary | ICD-10-CM

## 2014-07-28 DIAGNOSIS — I10 Essential (primary) hypertension: Secondary | ICD-10-CM | POA: Diagnosis present

## 2014-07-28 DIAGNOSIS — R0600 Dyspnea, unspecified: Secondary | ICD-10-CM | POA: Diagnosis not present

## 2014-07-28 DIAGNOSIS — I5032 Chronic diastolic (congestive) heart failure: Secondary | ICD-10-CM | POA: Diagnosis present

## 2014-07-28 DIAGNOSIS — Z87891 Personal history of nicotine dependence: Secondary | ICD-10-CM | POA: Diagnosis not present

## 2014-07-28 DIAGNOSIS — R778 Other specified abnormalities of plasma proteins: Secondary | ICD-10-CM | POA: Diagnosis present

## 2014-07-28 DIAGNOSIS — I482 Chronic atrial fibrillation: Secondary | ICD-10-CM | POA: Diagnosis present

## 2014-07-28 DIAGNOSIS — Z950 Presence of cardiac pacemaker: Secondary | ICD-10-CM

## 2014-07-28 DIAGNOSIS — J9620 Acute and chronic respiratory failure, unspecified whether with hypoxia or hypercapnia: Secondary | ICD-10-CM | POA: Diagnosis present

## 2014-07-28 DIAGNOSIS — Z79899 Other long term (current) drug therapy: Secondary | ICD-10-CM | POA: Diagnosis not present

## 2014-07-28 DIAGNOSIS — Z8249 Family history of ischemic heart disease and other diseases of the circulatory system: Secondary | ICD-10-CM

## 2014-07-28 DIAGNOSIS — R7989 Other specified abnormal findings of blood chemistry: Secondary | ICD-10-CM

## 2014-07-28 LAB — URINALYSIS, COMPLETE
BILIRUBIN, UR: NEGATIVE
GLUCOSE, UR: NEGATIVE mg/dL (ref 0–75)
Nitrite: NEGATIVE
Ph: 6 (ref 4.5–8.0)
Specific Gravity: 1.015 (ref 1.003–1.030)
Transitional Epi: 2

## 2014-07-28 LAB — COMPREHENSIVE METABOLIC PANEL
ALBUMIN: 3.3 g/dL — AB
ANION GAP: 11 (ref 7–16)
Alkaline Phosphatase: 84 U/L
BUN: 22 mg/dL — AB
Bilirubin,Total: 1.3 mg/dL — ABNORMAL HIGH
CALCIUM: 9.1 mg/dL
CREATININE: 0.76 mg/dL
Chloride: 91 mmol/L — ABNORMAL LOW
Co2: 34 mmol/L — ABNORMAL HIGH
EGFR (Non-African Amer.): >60
Glucose: 179 mg/dL — ABNORMAL HIGH
Potassium: 2.8 mmol/L — ABNORMAL LOW
SGOT(AST): 23 U/L
SGPT (ALT): 16 U/L
SODIUM: 136 mmol/L
TOTAL PROTEIN: 6.8 g/dL

## 2014-07-28 LAB — CBC
HCT: 44.6 % (ref 35.0–47.0)
HGB: 14.4 g/dL (ref 12.0–16.0)
MCH: 28.1 pg (ref 26.0–34.0)
MCHC: 32.3 g/dL (ref 32.0–36.0)
MCV: 87 fL (ref 80–100)
Platelet: 340 10*3/uL (ref 150–440)
RBC: 5.12 10*6/uL (ref 3.80–5.20)
RDW: 14.4 % (ref 11.5–14.5)
WBC: 42 10*3/uL — ABNORMAL HIGH (ref 3.6–11.0)

## 2014-07-28 LAB — CK-MB
CK-MB: 7.3 ng/mL — AB
CK-MB: 7.5 ng/mL — AB
CK-MB: 6.2 ng/mL — AB

## 2014-07-28 LAB — TROPONIN I
TROPONIN-I: 0.09 ng/mL — AB
Troponin-I: 0.07 ng/mL — ABNORMAL HIGH
Troponin-I: 0.09 ng/mL — ABNORMAL HIGH

## 2014-07-28 LAB — PRO B NATRIURETIC PEPTIDE: B-Type Natriuretic Peptide: 454 pg/mL — ABNORMAL HIGH

## 2014-07-28 MED ORDER — SODIUM CHLORIDE 0.9 % IJ SOLN
3.0000 mL | INTRAMUSCULAR | Status: DC | PRN
  Administered 2014-08-01: 3 mL via INTRAVENOUS
  Filled 2014-07-28: qty 10

## 2014-07-28 MED ORDER — SODIUM CHLORIDE 0.9 % IJ SOLN
3.0000 mL | Freq: Four times a day (QID) | INTRAMUSCULAR | Status: DC
  Administered 2014-07-28 – 2014-08-01 (×7): 3 mL via INTRAVENOUS
  Filled 2014-07-28 (×2): qty 10

## 2014-07-29 ENCOUNTER — Inpatient Hospital Stay: Payer: Commercial Managed Care - HMO

## 2014-07-29 DIAGNOSIS — R7989 Other specified abnormal findings of blood chemistry: Secondary | ICD-10-CM | POA: Diagnosis present

## 2014-07-29 DIAGNOSIS — I4891 Unspecified atrial fibrillation: Secondary | ICD-10-CM | POA: Diagnosis present

## 2014-07-29 DIAGNOSIS — J441 Chronic obstructive pulmonary disease with (acute) exacerbation: Secondary | ICD-10-CM | POA: Diagnosis present

## 2014-07-29 DIAGNOSIS — E876 Hypokalemia: Secondary | ICD-10-CM | POA: Diagnosis present

## 2014-07-29 DIAGNOSIS — J432 Centrilobular emphysema: Secondary | ICD-10-CM

## 2014-07-29 DIAGNOSIS — R778 Other specified abnormalities of plasma proteins: Secondary | ICD-10-CM | POA: Diagnosis present

## 2014-07-29 DIAGNOSIS — R0600 Dyspnea, unspecified: Secondary | ICD-10-CM

## 2014-07-29 LAB — CBC
HEMATOCRIT: 42 % (ref 35.0–47.0)
Hemoglobin: 13.9 g/dL (ref 12.0–16.0)
MCH: 28.8 pg (ref 26.0–34.0)
MCHC: 33.2 g/dL (ref 32.0–36.0)
MCV: 86.8 fL (ref 80.0–100.0)
PLATELETS: 341 10*3/uL (ref 150–440)
RBC: 4.84 MIL/uL (ref 3.80–5.20)
RDW: 14.1 % (ref 11.5–14.5)
WBC: 32.2 10*3/uL — ABNORMAL HIGH (ref 3.6–11.0)

## 2014-07-29 LAB — GLUCOSE, CAPILLARY
Glucose-Capillary: 150 mg/dL — ABNORMAL HIGH (ref 70–99)
Glucose-Capillary: 155 mg/dL — ABNORMAL HIGH (ref 70–99)
Glucose-Capillary: 155 mg/dL — ABNORMAL HIGH (ref 70–99)
Glucose-Capillary: 175 mg/dL — ABNORMAL HIGH (ref 70–99)

## 2014-07-29 LAB — BASIC METABOLIC PANEL
Anion gap: 11 (ref 5–15)
BUN: 27 mg/dL — AB (ref 6–20)
CALCIUM: 8.9 mg/dL (ref 8.9–10.3)
CHLORIDE: 90 mmol/L — AB (ref 101–111)
CO2: 38 mmol/L — ABNORMAL HIGH (ref 22–32)
Creatinine, Ser: 0.91 mg/dL (ref 0.44–1.00)
GFR calc Af Amer: >60 mL/min (ref >60)
GFR, EST NON AFRICAN AMERICAN: 54 mL/min — AB (ref >60)
Glucose, Bld: 165 mg/dL — ABNORMAL HIGH (ref 65–99)
Potassium: 3.8 mmol/L (ref 3.5–5.1)
Sodium: 139 mmol/L (ref 135–145)

## 2014-07-29 LAB — MAGNESIUM: MAGNESIUM: 1.8 mg/dL (ref 1.7–2.4)

## 2014-07-29 MED ORDER — HEPARIN SODIUM (PORCINE) 1000 UNIT/ML IJ SOLN: 5000.0000 mL | Freq: Three times a day (TID) | INTRAMUSCULAR | Status: DC

## 2014-07-29 MED ORDER — METHYLPREDNISOLONE SODIUM SUCC 125 MG IJ SOLR
60.0000 mg | Freq: Four times a day (QID) | INTRAMUSCULAR | Status: DC
  Administered 2014-07-29 (×2): 60 mg via INTRAVENOUS
  Administered 2014-07-29 (×2): 125 mg via INTRAVENOUS
  Administered 2014-07-30 (×2): 60 mg via INTRAVENOUS
  Administered 2014-07-30 (×2): 125 mg via INTRAVENOUS
  Administered 2014-07-31 (×3): 60 mg via INTRAVENOUS
  Administered 2014-07-31: 125 mg via INTRAVENOUS
  Administered 2014-08-01 (×2): 60 mg via INTRAVENOUS
  Filled 2014-07-29 (×14): qty 2

## 2014-07-29 MED ORDER — INSULIN ASPART 100 UNIT/ML ~~LOC~~ SOLN: 0.0000 [IU] | Freq: Three times a day (TID) | SUBCUTANEOUS | Status: DC

## 2014-07-29 MED ORDER — CLINDAMYCIN HCL 150 MG PO CAPS: 300.0000 mg | ORAL_CAPSULE | Freq: Three times a day (TID) | ORAL | Status: DC

## 2014-07-29 MED ORDER — LORAZEPAM 2 MG/ML IJ SOLN
0.5000 mg | INTRAMUSCULAR | Status: DC | PRN
  Administered 2014-07-31: 0.5 mg via INTRAVENOUS
  Filled 2014-07-29: qty 1

## 2014-07-29 MED ORDER — METOPROLOL TARTRATE 25 MG PO TABS
25.0000 mg | ORAL_TABLET | Freq: Four times a day (QID) | ORAL | Status: DC
  Administered 2014-07-29 – 2014-08-01 (×15): 25 mg via ORAL
  Filled 2014-07-29 (×15): qty 1

## 2014-07-29 MED ORDER — ACETYLCYSTEINE 20 % IN SOLN
2.0000 mL | Freq: Three times a day (TID) | RESPIRATORY_TRACT | Status: AC
  Administered 2014-07-30 – 2014-07-31 (×4): 2 mL via RESPIRATORY_TRACT
  Filled 2014-07-29 (×2): qty 4

## 2014-07-29 MED ORDER — IPRATROPIUM-ALBUTEROL 0.5-2.5 (3) MG/3ML IN SOLN: 3.0000 mL | Freq: Four times a day (QID) | RESPIRATORY_TRACT | Status: DC

## 2014-07-29 MED ORDER — MOMETASONE FURO-FORMOTEROL FUM 100-5 MCG/ACT IN AERO
2.0000 | INHALATION_SPRAY | Freq: Two times a day (BID) | RESPIRATORY_TRACT | Status: DC
  Administered 2014-07-29: 2 via RESPIRATORY_TRACT

## 2014-07-29 MED ORDER — HEPARIN SODIUM (PORCINE) 5000 UNIT/ML IJ SOLN
5000.0000 [IU] | Freq: Three times a day (TID) | INTRAMUSCULAR | Status: DC
  Administered 2014-07-29 – 2014-08-01 (×11): 5000 [IU] via SUBCUTANEOUS
  Filled 2014-07-29 (×11): qty 1

## 2014-07-29 MED ORDER — ASPIRIN EC 325 MG PO TBEC
325.0000 mg | DELAYED_RELEASE_TABLET | Freq: Every day | ORAL | Status: DC
  Administered 2014-07-29 – 2014-08-01 (×4): 325 mg via ORAL
  Filled 2014-07-29 (×4): qty 1

## 2014-07-29 MED ORDER — FUROSEMIDE 10 MG/ML IJ SOLN: 40.0000 mg | Freq: Two times a day (BID) | INTRAMUSCULAR | Status: DC

## 2014-07-29 MED ORDER — SERTRALINE HCL 25 MG PO TABS
25.0000 mg | ORAL_TABLET | Freq: Every day | ORAL | Status: DC
  Administered 2014-07-29 – 2014-08-01 (×4): 25 mg via ORAL
  Filled 2014-07-29 (×4): qty 1

## 2014-07-29 MED ORDER — TIOTROPIUM BROMIDE MONOHYDRATE 18 MCG IN CAPS
18.0000 ug | ORAL_CAPSULE | Freq: Every day | RESPIRATORY_TRACT | Status: DC
  Administered 2014-07-29: 18 ug via RESPIRATORY_TRACT

## 2014-07-29 MED ORDER — IPRATROPIUM-ALBUTEROL 0.5-2.5 (3) MG/3ML IN SOLN
3.0000 mL | Freq: Four times a day (QID) | RESPIRATORY_TRACT | Status: DC
  Administered 2014-07-30 – 2014-08-01 (×11): 3 mL via RESPIRATORY_TRACT
  Filled 2014-07-29 (×14): qty 3

## 2014-07-29 MED ORDER — POTASSIUM CHLORIDE IN NACL 40-0.9 MEQ/L-% IV SOLN
INTRAVENOUS | Status: DC
  Administered 2014-07-29 – 2014-07-31 (×3): 75 mL/h via INTRAVENOUS
  Filled 2014-07-29 (×7): qty 1000

## 2014-07-29 MED ORDER — MORPHINE SULFATE 2 MG/ML IJ SOLN: 2.0000 mg | INTRAMUSCULAR | Status: DC | PRN

## 2014-07-29 MED ORDER — ACETAMINOPHEN 325 MG PO TABS
650.0000 mg | ORAL_TABLET | ORAL | Status: DC | PRN
  Administered 2014-07-30: 650 mg via ORAL
  Filled 2014-07-29: qty 2

## 2014-07-29 MED ORDER — FUROSEMIDE 10 MG/ML IJ SOLN: 40.0000 mg | Freq: Two times a day (BID) | INTRAMUSCULAR | Status: DC | PRN

## 2014-07-29 MED ORDER — FAMOTIDINE 20 MG PO TABS
20.0000 mg | ORAL_TABLET | Freq: Two times a day (BID) | ORAL | Status: DC
  Administered 2014-07-29 – 2014-08-01 (×7): 20 mg via ORAL
  Filled 2014-07-29 (×7): qty 1

## 2014-07-29 MED ORDER — ACETYLCYSTEINE 10 % IN SOLN
4.0000 mL | Freq: Three times a day (TID) | RESPIRATORY_TRACT | Status: DC
  Filled 2014-07-29 (×3): qty 4

## 2014-07-29 MED ORDER — IPRATROPIUM-ALBUTEROL 0.5-2.5 (3) MG/3ML IN SOLN
3.0000 mL | RESPIRATORY_TRACT | Status: DC | PRN
  Administered 2014-07-29 (×2): 3 mL via RESPIRATORY_TRACT
  Filled 2014-07-29 (×2): qty 3

## 2014-07-29 MED ORDER — MORPHINE SULFATE 2 MG/ML IJ SOLN: 1.0000 mg | Freq: Four times a day (QID) | INTRAMUSCULAR | Status: DC | PRN

## 2014-07-29 MED ORDER — CEFTRIAXONE SODIUM IN DEXTROSE 20 MG/ML IV SOLN
1.0000 g | INTRAVENOUS | Status: DC
  Administered 2014-07-29 – 2014-08-01 (×4): 1 g via INTRAVENOUS
  Filled 2014-07-29 (×6): qty 50

## 2014-07-29 MED ORDER — LEVOFLOXACIN IN D5W 750 MG/150ML IV SOLN
750.0000 mg | INTRAVENOUS | Status: DC
  Administered 2014-07-30: 750 mg via INTRAVENOUS
  Filled 2014-07-29 (×2): qty 150

## 2014-07-29 MED ORDER — BUDESONIDE 0.5 MG/2ML IN SUSP
0.2500 mg | Freq: Two times a day (BID) | RESPIRATORY_TRACT | Status: AC
  Administered 2014-07-30: 0.25 mg via RESPIRATORY_TRACT
  Administered 2014-07-30: 0.5 mg via RESPIRATORY_TRACT
  Filled 2014-07-29 (×4): qty 2

## 2014-07-29 MED ORDER — ONDANSETRON HCL 4 MG/2ML IJ SOLN
4.0000 mg | INTRAMUSCULAR | Status: DC | PRN
  Administered 2014-07-30 – 2014-07-31 (×2): 4 mg via INTRAVENOUS
  Filled 2014-07-29 (×2): qty 2

## 2014-07-29 NOTE — Progress Notes (Signed)
Patient is alert and oriented  this shift but lethargic. No signs and symptoms of pain or any acute distress noted. Patient rhonchi and has hoarse voice. Lasix 40 mg PRN administered and patient has been duiresing well.  Blood culture 8-12 hours came in with no growth. Pt. Is resting quietly at this time with her eyes closed, respirations even and unlabored.

## 2014-07-29 NOTE — H&P (Signed)
PATIENT NAME:  Christine Bowen, Christine Bowen MR#:  [id number] DATE OF BIRTH:  1922-07-08  DATE OF ADMISSION:  05/16/2014  REFERRING PHYSICIAN: Rebecka ApleyAllison P. Webster, MD  PRIMARY CARE PHYSICIAN: Dale Durhamharlene Scott, MD   ADMISSION DIAGNOSIS: Pneumonia.   HISTORY OF PRESENT ILLNESS: This is an 81 year old Caucasian female who presents to the Emergency Department after calling for help at her assisted living facility. The patient was unable to stand from the commode and was found to be cachectic and hypoxic. Notably, she had been feeling unwell for a few days prior, but had just started Cipro for a urinary tract infection on the day of admission. Her daughter states that the patient seemed to be more confused over the last week and that she has had a slight cough, but has not been significantly different than her usual dry cough she experiences due to her COPD. The patient denies shortness of breath, but she was tachypnea on EMS arrival. In the Emergency Department, the patient was found to have hypoxia and was placed on BiPAP with initially 60% FiO2 to maintain her saturations more than 95%. She has denied chest pain, nausea, vomiting, or diaphoresis. Chest x-ray revealed a left lower lobe pneumonia and possible parapneumonic effusion, which prompted the Emergency Department to call for admission.   REVIEW OF SYSTEMS:  CONSTITUTIONAL: The patient denies fever or weakness.  EYES: Denies blurry vision or inflammation.  EARS, NOSE AND THROAT: Denies tinnitus or sore throat.  RESPIRATORY: Denies cough or shortness of breath.  CARDIOVASCULAR: Denies chest pain, palpitations, orthopnea, paroxysmal nocturnal dyspnea.  GASTROINTESTINAL: Denies nausea, vomiting, diarrhea, or abdominal pain.  GENITOURINARY: Denies dysuria, increased frequency, or hesitancy of urination.  ENDOCRINE: Denies polyuria or polydipsia.  INTEGUMENTARY: Denies rashes or lesions.  HEMATOLOGIC AND LYMPHATIC: Denies easy bruising or bleeding.   MUSCULOSKELETAL: Denies arthralgias or myalgias.  NEUROLOGIC: Denies numbness in extremities or dysarthria.  PSYCHIATRIC: Denies depression or suicidal ideation.   PAST MEDICAL HISTORY: COPD, hypertension, atrial fibrillation, urinary tract infection.   PAST SURGICAL HISTORY: Pacemaker placement, cholecystectomy, appendectomy, as well as cataract removal.   SOCIAL HISTORY: The patient is a resident at an assisted living facility. She is a former smoker but quit 30 years ago. She denies drugs or alcohol use.   FAMILY HISTORY: The patient has a brother who is deceased of complications of coronary artery disease.   MEDICATIONS:  1.  Advair Diskus 250 mcg/50 mcg 1 puff inhaled b.i.d.  2.  Aspirin 325 mg 1 tablet p.o. daily.  3.  Dexamethasone/tobramycin ophthalmologic suspension 0.1%/0.3% solution 1 drop to each affected eye for 7 days every month.  4.  Cheratussin 100 mg/5 mL 5 mL every 4 hours as needed for cough.  5.  Imodium AD 2 mg 1 tablet p.o. every 4 hours as needed for diarrhea.  6.  Oxygen therapy at 3 L by nasal cannula constantly.  7.  Spiriva 18 mcg 1 capsule inhaled 1 time per day.  8.  Toprol-XL 25 mg extended release 1 tablet p.o. daily.  9.  Tylenol 325 mg 2 tablets p.o. every 4 hours as needed for pain or temperature.  10.  Tylenol 500 mg 2 tablets p.o. every 4 hours as needed for headache or fever more than 101.  11.  Ventolin high flow inhaler 90 mcg/inhalations 2 puffs inhaled every 6 hours as needed for shortness of breath.  12.  Zantac 150 mg 1 tablet p.o. daily.  13.  Zoloft 25 mg 1 tablet p.o. daily.   ALLERGIES:  LIPITOR, PREDNISONE, PROTONIX, AND ZOCOR.   PERTINENT LABORATORY RESULTS AND RADIOGRAPHIC FINDINGS: Serum glucose 223, BNP 565, BUN is 15, creatinine 0.9, serum sodium 140, potassium 4, chloride 103, bicarbonate is 27, calcium is 9.2, phosphorus is 3.6, magnesium 1.6. Albumin 3.8, alkaline phosphatase 109, AST is 25, ALT is 13. Troponin is 0.07. White  blood cell count is 28.8, hemoglobin is 14.5, hematocrit 45.4, platelet count 333,000, MCV is 88. INR is 1. Urinalysis shows 24 white blood cells per high power field, 6 red blood cells per high-power field, 1+ leukocyte esterase, it is nitrite  negative, 150 mg/dL of glucose. ABG shows a pH of 7.31, pCO2 of 56, pO2 of 112 on FiO2 of 60% with a base excess of 1. Chest x-ray shows left lower lobe infiltrate superimposed on severe COPD. There is possible small left parapneumonic effusion.   PHYSICAL EXAMINATION:  VITAL SIGNS: Temperature is 97.9, pulse 68, respirations 18, blood pressure 172/94, pulse oximetry is 95% on BiPAP with FiO2 of 40%.  GENERAL: The patient is alert and oriented, in no apparent distress.  HEENT: Normocephalic, atraumatic. Pupils equal, round, and reactive to light and accommodation. Extraocular movements are intact. Mucous membranes are moist.  NECK: Trachea is midline. No adenopathy. Thyroid is nonpalpable and nontender.  CHEST: Symmetric and atraumatic.  CARDIOVASCULAR: Regular rate and rhythm. Normal S1, S2. No rubs, clicks, or murmurs appreciated.  LUNGS: Faint rhonchi and some expiratory wheezes bilaterally, but overall normal effort and excursion. The patient is wearing BiPAP at this time. On my exam, the patient's oxygen saturations were 100% on 60% FiO2. I have decreased the patient's inhaled fraction of oxygen to 40%. Saturations have maintained more than 93% on this amount of oxygen.  ABDOMEN: Positive bowel sounds. Soft, nontender, nondistended. No hepatosplenomegaly.  GENITOURINARY: Deferred.  MUSCULOSKELETAL: The patient moves all 4 extremities. Full range of motion. She has 5/5 strength in upper and lower extremities bilaterally.  SKIN: No rashes or lesions. The patient does have some discoloration to her lower extremities around the ankles likely secondary to venous stasis or chronic oxygen use. EXTREMITIES: No clubbing, cyanosis, or edema.  NEUROLOGIC: Cranial  nerves II-XII are grossly intact.  PSYCHIATRIC: Mood is normal. Affect is congruent. The patient appears good insight and judgment into her medical illness.   ASSESSMENT AND PLAN: This 81 year old female admitted for healthcare-acquired pneumonia.  1.  Healthcare-acquired pneumonia. As the patient lives in an assisted-living facility, we will treat for healthcare-acquired pneumonia. She has been given Levaquin and vancomycin in the Emergency Department, which we will continue. A pulmonary consult has been ordered for the apparent parapneumonic effusion as well. We will hope to wean BiPAP as well as her supplemental oxygen her baseline levels. She is on 3 L of oxygen at baseline at home.  2.  Chronic obstructive pulmonary disease. We will continue Advair, Spiriva, and albuterol.  3.  Hypertension. Continue metoprolol ER.  4.  Atrial fibrillation. Continue aspirin.  5.  Urinary tract infection. It should be covered by the antibiotics above.  6.  Glucose intolerance. The patient has a random blood sugar more than 200. She likely has diabetes and I will check a hemoglobin A1c to verify this.  7.  Deep vein thrombosis prophylaxis. Heparin.  8.  Gastrointestinal prophylaxis. None, as the patient is not critically ill.   CODE STATUS: The patient is a DO NOT RESUSCITATE. She does not want resuscitative measures in the event of cardiopulmonary collapse.   TIME SPENT ON ADMISSION ORDERS AND PATIENT  CARE: Approximately 35 minutes.    ____________________________ Kelton Pillar. Sheryle Hail, MD msd:bm D: 05/16/2014 07:12:12 ET T: 05/16/2014 07:53:11 ET JOB#: [id number]  cc: Kelton Pillar. Sheryle Hail, MD, <Dictator> Kelton Pillar Analise Glotfelty MD ELECTRONICALLY SIGNED 05/24/2014 0:44

## 2014-07-29 NOTE — Consult Note (Signed)
Date: 07/29/2014,   MRN# [id number] Christine Bowen 11/24/1922    AdmissionWeight: 115 lb (52.164 kg)                 CurrentWeight: 115 lb (52.164 kg) Christine Bowen is a 81 y.o. old female seen in consultation for COPD Exacerbation at the request of Dr. Judithann SheenSparks   Assessment and Plan: [age] female with PMHx of Asthma\COPD (III), seen in consultation for possible COPD Exacerbation   AECOPD - trigger unknown - patient with significant upper airway congestion, no inflitrates on cxr.   - cont with levaquin and rocephin - obtain sputum culture - pulmicort neb, schedule mucomyst and duonebs - cont with IV steroids, consider switching to po in 1-2 days and then wean - incentive spirometry  COPD\Emphysema - management as above  Afib with RVR - now back spontaneously in NSR - cont to monitor closely  Respiratory Distress - secondary to AECOPD   CC: short of breath  HPI:  81 year old female, currently a resident at the [facility] with history of COPD and chronic respiratory failure, as well as history of chronic atrial fibrillation and sick sinus syndrome status post pacemaker. The patient was hospitalized approximately 9 days ago for COPD exacerbation and bronchitis. The patient was discharged to [facility] where she has been experiencing recurrent, worsening shortness of breath. In the Emergency Room, the patient was tachypneic, hypoxic, with chest x-ray not revealing obvious pneumonia. The patient was in atrial fibrillation with a rapid ventricular rate. Admission laboratories were notable for a troponin of 0.07 in the absence of chest pain. The patient had markedly elevated white count of 42,000. Off note, review of chart shows that patient saw her Primary pulmonologist on 07/26/14 for inc sob - duonebs were continued, advised to try mucomyst.   PMHX:   Past Medical History  Diagnosis Date  . COPD (chronic obstructive pulmonary disease)   . Hypertension   .  Hypercholesterolemia   . Diverticulosis   . Hyperglycemia    Surgical Hx:  Past Surgical History  Procedure Laterality Date  . Tonsillectomy  1861  . Cholecystectomy  1978  . Wrist surgery  1988    removal of a growth   Family Hx:  Family History  Problem Relation Age of Onset  . CVA Father     cerebral hemorrhage  . Heart disease Mother     myocardial infarction  . Heart disease Brother     myocardial infarction  . Cancer Brother     unknown type  . Breast cancer Neg Hx   . Colon cancer Neg Hx    Social Hx:   History  Substance Use Topics  . Smoking status: Former Games developermoker  . Smokeless tobacco: Former NeurosurgeonUser    Quit date: 03/31/1983  . Alcohol Use: No   Medication:    Home Medication:  No current outpatient prescriptions on file.  Current Medication: @CURMEDTAB @   Allergies:  Lipitor; Prednisone; Premarin; Protonix; and Zocor  Review of Systems: Gen:  Denies  fever, sweats, chills HEENT: Denies blurred vision, double vision, ear pain, eye pain, hearing loss, nose bleeds, sore throat Cvc:  No dizziness, chest pain or heaviness Resp:   Short of breath and coughing Gi: Denies swallowing difficulty, stomach pain, nausea or vomiting, diarrhea, constipation, bowel incontinence Gu:  Denies bladder incontinence, burning urine Ext:   No Joint pain, stiffness or swelling Skin: No skin rash, easy bruising or bleeding or hives Endoc:  No polyuria,  polydipsia , polyphagia or weight change Psych: No depression, insomnia or hallucinations  Other:  All other systems negative  Physical Examination:   VS: BP 168/70 mmHg  Pulse 77  Temp(Src) 97.7 F (36.5 C) (Oral)  Resp 20  Wt 115 lb (52.164 kg)  SpO2 98%  GENERAL: The patient is acutely ill-appearing, in moderate respiratory distress.  OROPHARYNX: Dry, but clear.  NECK: Supple without JVD. No adenopathy or thyromegaly was noted.  LUNGS: Reveal diffuse wheezes and rhonchi. Basilar crackles are also noted. No dullness.  No use of accessory muscles.  CARDIAC: Is an irregularly irregular rhythm with a rapid rate. No significant rubs or gallops. PMI is nondisplaced. Chest wall is nontender.  ABDOMEN: Soft, nontender, with normoactive bowel sounds. No organomegaly or masses were appreciated. No hernias or bruits were noted.  EXTREMITIES: Without clubbing, cyanosis, or edema. Stasis changes were noted. Distal pulses are 1+ bilaterally.  SKIN: Was warm and dry without rash or lesions.  NEUROLOGIC: cranial nerves II through XII grossly intact. Deep tendon reflexes were symmetric. Motor and sensory exam is nonfocal.  PSYCHIATRIC: alert and oriented to person, place, and time. She was cooperative and used good judgment.    Labs results:   Recent Labs     07/28/14  1002  07/29/14  0432  HGB  14.4  13.9  HCT  44.6  42.0  MCV  87  86.8  WBC  42.0*  32.2*  BUN  22*  27*  CREATININE  0.76  0.91  GLUCOSE   --   165*  CALCIUM  9.1  8.9  ,    No results for input(s): PH in the last 72 hours.  Invalid input(s): PCO2, PO2, BASEEXCESS, BASEDEFICITE, TFT  Culture results:     Rad results: (The following images and results were reviewed by Dr. Dema Severin). CXR 07/28/14 FINDINGS: There is underlying emphysematous change. There is stable fibrotic type change in the base regions. There is no edema or consolidation. Heart size is normal. Pulmonary vascularity reflects underlying emphysema. No adenopathy. Pacemaker leads are attached to the right atrium and right ventricle. There is atherosclerotic change in the aortic arch region.  IMPRESSION: Underlying emphysematous change. Bibasilar fibrotic type change. No edema or consolidation. No change in cardiac silhouette.  Assessment and Plan: As Above.      Thank  you for the consultation and for allowing  [facility] and Consult Team  to assist in the care of your patient. Our recommendations are noted above.  Please contact us if we can be of  further service.  Stephanie Acre, MD [facility] Pager [phone] (Please enter [phone]-[phone])

## 2014-07-29 NOTE — Progress Notes (Signed)
[facility] PROGRESS NOTE  Christine Bowen ZOX:[id number] DOB: Jul 11, 1922 DOA: 07/28/2014 PCP: Dale Caldwell, MD  HPI/Subjective: Pt states she is feeling about the same today.  Daughter is in the room with her and says she feels the patient is breathing a little easier.  Pt complains of hoarseness, and daughter states that she has had this hoarseness for several weeks.  ROS: Constitutional: No fevers/chills, fatigue, weakness Eyes: No blurred or double vision, pain, redness ENT: Hoarseness, no ear pain, hearing loss, difficulty swallowing Resp: Wheeze, cough, improving dyspnea, no hemoptysis Cardio-vascular: No chest pain, orthopnea, edema, palpitations, syncope GI: No nausea/vomiting/diarrhea, abdominal pain, constipation GU: No dysuria, hematuria, frequency Endocrine: No nocturia, thyroid problems, heat or cold intolerance Hematologic/Lymphatic: No anemia, easy bruising bleeding, swollen glands Integumentary: No acne, rash, lesions Musculoskeletal: No acute arthritis, joint swelling, gout Neuro: No numbness, weakness, headache Psych: No anxiety, insomnia, depression  Objective:  Intake/Output Summary (Last 24 hours) at 07/29/14 1144 Last data filed at 07/29/14 1038  Gross per 24 hour  Intake      0 ml  Output    100 ml  Net   -100 ml    Exam:   Constitutional: Filed Vitals:   07/29/14 0437 07/29/14 0734  BP: 168/70   Pulse: 77   Temp: 97.7 F (36.5 C)   TempSrc: Oral   Resp: 20   Weight: 52.164 kg (115 lb)   SpO2: 100% 98%   Wt Readings from Last 3 Encounters:  07/29/14 52.164 kg (115 lb)  06/28/14 56.155 kg (123 lb 12.8 oz)  05/31/14 57.72 kg (127 lb 4 oz)   General:  Well dressed, well nourished, in no apparent distress HEENT: PERRL, EOMI, no scleral icterus, no conjunctivitis, no difficulty hearing Neck: supple, no masses, non tender, no cervical adenopathy, no JVD, thyroid not enlarged Cardiovascular: RRR, no m/r/g, no S3/S4, good pedal  pulses, no LE edema. Respiratory: diffuse bilateral wheeze, no rales, no ronchi, breath sounds not diminished, no increased respiratory effort Abdomen: soft, nontender, nondistended, no mass, bowel sounds present Musculoskeletal: 5/5 muscular strength x4 extremities, full spontaneous range of motion throughout, no cyanosis/clubbing Skin: no rash, no lesions, bilateral LE ecchymosis, warm and dry  Lymphatic: No adenopathy Neurologic: Cranial nerves intact, sensation intact, no dysarthria, no aphasia Psychiatric: Alert, Oriented to time, person, place, circumstance, cooperative, not confused, not agitated, not depressed    Data Reviewed: Basic Metabolic Panel:  Recent Labs Lab 07/28/14 1002 07/29/14 0432  NA  --  139  K  --  3.8  CL  --  90*  CO2 34* 38*  GLUCOSE  --  165*  BUN 22* 27*  CREATININE 0.76 0.91  CALCIUM 9.1 8.9  MG  --  1.8   Liver Function Tests:  Recent Labs Lab 07/28/14 1002  AST 23  ALT 16  ALKPHOS 84  PROT 6.8  ALBUMIN 3.3*   No results for input(s): LIPASE, AMYLASE in the last 168 hours. No results for input(s): AMMONIA in the last 168 hours. CBC:  Recent Labs Lab 07/28/14 1002 07/29/14 0432  WBC 42.0* 32.2*  HGB 14.4 13.9  HCT 44.6 42.0  MCV 87 86.8  PLT 340 341   Cardiac Enzymes: No results for input(s): CKTOTAL, CKMB, CKMBINDEX, TROPONINI in the last 168 hours. BNP (last 3 results) No results for input(s): BNP in the last 8760 hours.  ProBNP (last 3 results) No results for input(s): PROBNP in the last 8760 hours.  CBG:  Recent Labs Lab 07/29/14 0800  GLUCAP 150*    Recent Results (from the past 240 hour(s))  Culture, blood (single)     Status: None (Preliminary result)   Collection Time: 07/28/14 11:31 AM  Result Value Ref Range Status   Micro Text Report   Preliminary       COMMENT                   NO GROWTH IN 18-24 HOURS   ANTIBIOTIC                                                      Culture, blood (single)      Status: None (Preliminary result)   Collection Time: 07/28/14 11:31 AM  Result Value Ref Range Status   Micro Text Report   Preliminary       COMMENT                   NO GROWTH IN 18-24 HOURS   ANTIBIOTIC                                                         Studies: Dg Chest 2 View  07/29/2014   CLINICAL DATA:  Shortness of Breath  EXAM: CHEST  2 VIEW  COMPARISON:  July 28, 2014  FINDINGS: Underlying emphysematous changes again noted. Fibrotic type change in the base regions remains. There is no frank edema or consolidation. No new opacity. The heart size is normal. The pulmonary vascular reflects underlying emphysematous change. Pacemaker leads are attached to the right atrium and right ventricle. Atherosclerotic changes again noted.  IMPRESSION: No change from 1 day prior. Underlying emphysematous change with bibasilar fibrosis. No edema or consolidation.   Electronically Signed   By: Bretta BangWilliam  Woodruff III M.D.   On: 07/29/2014 10:34   Ct C Spine Ltd Wo Or W Cm  07/28/2014   CLINICAL DATA:  Pain and confusion following fall  EXAM: CT HEAD WITHOUT CONTRAST  CT CERVICAL SPINE WITHOUT CONTRAST  TECHNIQUE: Multidetector CT imaging of the head and cervical spine was performed following the standard protocol without intravenous contrast. Multiplanar CT image reconstructions of the cervical spine were also generated.  COMPARISON:  Head CT June 11, 2014  FINDINGS: CT HEAD FINDINGS  There is moderate diffuse atrophy. There is no intracranial mass, hemorrhage, extra-axial fluid collection, or midline shift. There is small vessel disease throughout the centra semiovale bilaterally, stable. There is no new gray-white compartment lesion. No acute infarct apparent. Bony calvarium appears intact. Mastoids on the left are clear. There is opacification of several inferior mastoids on the right. Most of the mastoids on the right are clear. There is a prominent concha bullosa on the left, an anatomic variant.   CT CERVICAL SPINE FINDINGS  There is no acute fracture or spondylolisthesis. There is evidence of either old trauma involving the superior most aspect of the odontoid or small os odontoidia, an anatomic variant. Prevertebral soft tissues and predental space regions are normal.  There is moderately severe disc space narrowing at C5-6 and C6-7. There is milder narrowing at C3-4 and C4-5. There is facet hypertrophy to varying degrees at all  levels bilaterally. There is moderate exit foraminal narrowing at C3-4, C4-5, and C5-6 bilaterally due to bony hypertrophy. There is no frank disc extrusion or stenosis.  There is bullous disease in the apices. There are foci of carotid artery calcification bilaterally.  IMPRESSION: CT head: Atrophy with periventricular small vessel disease, stable. No intracranial mass, hemorrhage, or extra-axial fluid collection. No acute appearing infarct. Opacification of several inferior mastoid air cells without air-fluid level. Mastoids elsewhere clear.  CT cervical spine: Multilevel arthropathy. No acute fracture or spondylolisthesis. Question old trauma involving the superior most aspect of the odontoid versus os odontoidia in this region. There does not appear to be acute fracture in this area. There is bullous disease in each lung apex. There are foci of carotid artery calcification bilaterally.   Electronically Signed   By: Bretta Bang III M.D.   On: 07/28/2014 10:59   Dg Chest Port 1 View  07/28/2014   CLINICAL DATA:  Shortness of breath. Atrial fibrillation. History of COPD  EXAM: PORTABLE CHEST - 1 VIEW  COMPARISON:  July 18, 2014  FINDINGS: There is underlying emphysematous change. There is stable fibrotic type change in the base regions. There is no edema or consolidation. Heart size is normal. Pulmonary vascularity reflects underlying emphysema. No adenopathy. Pacemaker leads are attached to the right atrium and right ventricle. There is atherosclerotic change in the  aortic arch region.  IMPRESSION: Underlying emphysematous change. Bibasilar fibrotic type change. No edema or consolidation. No change in cardiac silhouette.   Electronically Signed   By: Bretta Bang III M.D.   On: 07/28/2014 11:02   Ct Head Limited W/o Cm  07/28/2014   CLINICAL DATA:  Pain and confusion following fall  EXAM: CT HEAD WITHOUT CONTRAST  CT CERVICAL SPINE WITHOUT CONTRAST  TECHNIQUE: Multidetector CT imaging of the head and cervical spine was performed following the standard protocol without intravenous contrast. Multiplanar CT image reconstructions of the cervical spine were also generated.  COMPARISON:  Head CT June 11, 2014  FINDINGS: CT HEAD FINDINGS  There is moderate diffuse atrophy. There is no intracranial mass, hemorrhage, extra-axial fluid collection, or midline shift. There is small vessel disease throughout the centra semiovale bilaterally, stable. There is no new gray-white compartment lesion. No acute infarct apparent. Bony calvarium appears intact. Mastoids on the left are clear. There is opacification of several inferior mastoids on the right. Most of the mastoids on the right are clear. There is a prominent concha bullosa on the left, an anatomic variant.  CT CERVICAL SPINE FINDINGS  There is no acute fracture or spondylolisthesis. There is evidence of either old trauma involving the superior most aspect of the odontoid or small os odontoidia, an anatomic variant. Prevertebral soft tissues and predental space regions are normal.  There is moderately severe disc space narrowing at C5-6 and C6-7. There is milder narrowing at C3-4 and C4-5. There is facet hypertrophy to varying degrees at all levels bilaterally. There is moderate exit foraminal narrowing at C3-4, C4-5, and C5-6 bilaterally due to bony hypertrophy. There is no frank disc extrusion or stenosis.  There is bullous disease in the apices. There are foci of carotid artery calcification bilaterally.  IMPRESSION: CT  head: Atrophy with periventricular small vessel disease, stable. No intracranial mass, hemorrhage, or extra-axial fluid collection. No acute appearing infarct. Opacification of several inferior mastoid air cells without air-fluid level. Mastoids elsewhere clear.  CT cervical spine: Multilevel arthropathy. No acute fracture or spondylolisthesis. Question old trauma involving the superior  most aspect of the odontoid versus os odontoidia in this region. There does not appear to be acute fracture in this area. There is bullous disease in each lung apex. There are foci of carotid artery calcification bilaterally.   Electronically Signed   By: Bretta Bang III M.D.   On: 07/28/2014 10:59    Scheduled Meds: . aspirin EC  325 mg Oral Daily  . cefTRIAXone (ROCEPHIN)  IV  1 g Intravenous Q24H  . famotidine  20 mg Oral BID  . heparin subcutaneous  5,000 Units Subcutaneous 3 times per day  . insulin aspart  0-10 Units Subcutaneous TID WC & HS  . [START ON 07/30/2014] levofloxacin (LEVAQUIN) IV  750 mg Intravenous Q48H  . methylPREDNISolone (SOLU-MEDROL) injection  60 mg Intravenous Q6H  . metoprolol tartrate  25 mg Oral QID  . mometasone-formoterol  2 puff Inhalation BID  . sertraline  25 mg Oral Daily  . sodium chloride  3 mL Intravenous 4 times per day  . tiotropium  18 mcg Inhalation Daily   Continuous Infusions: . 0.9 % NaCl with KCl 40 mEq / L 75 mL/hr (07/29/14 0454)    Assessment/Plan: Principal Problem:   Acute on chronic respiratory failure - on O2 chronically, continue O2 here.  felt to be recurrent COPD exacerbation.  Continue steroids, antibiotics.  Add duonebs PRN for wheezing.  Pulmonology consult recommendations pending. WBC trending down  Active Problems:   COPD exacerbation - treatment as above, continue home meds for the same   Atrial fibrillation with rapid ventricular response - spontaneously converted to NSR with controlled rate this morning.  Defer anticoagulation at this time  per cardiology recommendation.  Appreciate their help.   Hyperglycemia - controlled, continue SSI and FSBS   Hypokalemia - normalized this morning, continue to monitor   Elevated troponin - likely due to afib and RVR, demand ischemia.  Do not anticoagulate at this time per Cardiology as above.  Monitor, CE trend stable.    Code Status:     Code Status Orders        Start     Ordered   07/28/14 1617  Do not attempt resuscitation (DNR)   Continuous    Question Answer Comment  In the event of cardiac or respiratory ARREST Do not call a "code blue"   In the event of cardiac or respiratory ARREST Do not perform Intubation, CPR, defibrillation or ACLS   In the event of cardiac or respiratory ARREST Use medication by any route, position, wound care, and other measures to relive pain and suffering. May use oxygen, suction and manual treatment of airway obstruction as needed for comfort.   Comments Nurse May Pronouce      07/28/14 1643     Family Communication: Daughter present in room with patient  Consultants:  Cardiology  Pulmonology   Time spent: 45 min  Kristeen Miss  [facility] 07/29/2014, 11:44 AM

## 2014-07-29 NOTE — H&P (Signed)
PATIENT NAME:  Christine Bowen, Christine Bowen MR#:  [id number] DATE OF BIRTH:  09-14-1922  DATE OF ADMISSION:  07/18/2014  PRESENTING COMPLAINT: Shortness of breath and wheezing.   History is obtained from the patient and the patient's daughter.   PRIMARY CARE PHYSICIAN: Dale Slatedale, MD   PULMONOLOGIST: Clenton Pare. Meredeth Ide, MD   CARDIOLOGIST: Darlin Priestly. Lady Gary, MD   HISTORY OF PRESENT ILLNESS: Christine Bowen is a pleasant 81 year old Caucasian female with past medical history of chronic obstructive pulmonary disease on 3 liters of home oxygen chronic, history of hypertension, atrial fibrillation, comes in from [facility] with complaints of increasing shortness of breath, some cough, which is chronic. However, has gotten more aggravated for the last couple of days along with wheezing. She comes in the Emergency Room, was found to be in rapid atrial fibrillation with heart rate in the 130s. She received some breathing treatment, a dose of IV Solu-Medrol and appears hemodynamically stable at present. Her heart rate during my evaluation is in the 70s. She is being admitted for acute on chronic respiratory failure due to chronic obstructive pulmonary disease exacerbation and chronic cough. No pneumonia noted on chest x-ray. She received a dose of Zithromax.   PAST MEDICAL HISTORY: 1. Chronic atrial fibrillation.  2. Hypertension.  3. Chronic obstructive pulmonary disease on home oxygen.  4. Status post pacemaker.  5. Cholecystectomy.  6. Appendectomy.  7. Cataract removal.   SOCIAL HISTORY: She is a resident at [facility]. She is a former smoker, quit 30 years ago. Denies alcohol or drug use.   FAMILY HISTORY: Positive for coronary artery disease.   ALLERGIES: LIPITOR, PREDNISONE AND PROTONIX AND ZOCOR.    HOME MEDICATIONS:  1. Ventolin HFA 90 mcg 2 puffs every 6 hours as needed.  2. Toprol-XL 25 mg daily.  3. Spiriva 18 mcg inhalation daily.  4. Sertraline 25 mg p.o. daily.  5. Ranitidine 150 mg  p.o. daily.  6. Q-Tussin 100 mg/5 mL every 6 hours as needed.  7. Meclizine 25 mg 1 tablet t.i.d.  8. Mapap 325 mg 2 tablets every 6 hours.  9. Loperamide 2 mg 1 capsule every 4 hours.  10. Aspirin 325 mg 1 tablet p.o. daily.  11. Align 4 mg 1 capsule daily.  12. Advair Diskus 250/50 one puff b.i.d.   REVIEW OF SYSTEMS:  CONSTITUTIONAL: No fever. Positive for fatigue, weakness.  EYES: No blurred or double vision, glaucoma, or cataracts.  EARS, NOSE AND THROAT: No tinnitus, ear pain, hearing loss.  RESPIRATORY: Positive for wheezing. She chronic obstructive pulmonary disease and shortness of breath. Positive for cough.  CARDIOVASCULAR: No chest pain, orthopnea, edema.  GASTROINTESTINAL: No nausea, vomiting, diarrhea or abdominal pain.  GENITOURINARY: No dysuria, hematuria, frequency.  ENDOCRINE: No polyuria, nocturia or thyroid problems.  HEMATOLOGY: No anemia or easy bruising.  SKIN: No acne or rash.  MUSCULOSKELETAL: Positive for arthritis and back pain.  NEUROLOGIC: No CVA, transient ischemic attack. PSYCHOLOGIC: No anxiety or depression. All other systems reviewed and negative.   PHYSICAL EXAMINATION:  GENERAL: The patient is awake, alert, oriented x3, not in acute distress.  VITAL SIGNS: Afebrile. Pulse is 70, irregularly irregular. Blood pressure 135/90. Sats are 100% on 3 liters.  HEENT: Atraumatic, normocephalic. Pupils are equal, round and reactive to light and accommodation. EOM intact. Oral mucosa is moist.  NECK: Supple. No JVD. No carotid bruit.  RESPIRATORY: There are bilateral wheezing, extensive expiratory. No use of accessory muscles. Rhonchi heard. No crackles.  CARDIOVASCULAR: Irregularly irregular heart  rhythm, no murmur. PMI not lateralized. Chest is nontender.  EXTREMITIES: Good pedal pulses, good femoral pulses. No lower extremity edema.  ABDOMEN: Soft, benign, nontender. No organomegaly. Positive bowel sounds.  NEUROLOGIC: Grossly intact cranial nerves II  through XII. No motor or sensory deficits.  PSYCHIATRIC: The patient is awake, alert, oriented x3.  SKIN: Warm and dry.   LABORATORY AND IMAGING DATA: EKG showed atrial fibrillation with rapid ventricular response. Chest x-ray no acute cardiopulmonary abnormality. CBC within normal limits. Troponin is 0.06, glucose is 154, BUN is 10, creatinine is 0.79, potassium is 3.0, chloride is 91, bicarbonate is 31. Urinalysis negative for urinary tract infection. CT head essentially negative. CT head was done in 07/12/2014 was essentially negative.   ASSESSMENT AND PLAN: An 81 year old, Christine Bowen, with history of chronic respiratory failure with chronic obstructive pulmonary disease on chronic home oxygen, hypertension, comes in with:   1. Acute on chronic respiratory failure secondary to chronic obstructive pulmonary disease exacerbation. Will keep the patient on medical floor.  2. Start IV Solu-Medrol around the clock, change to p.o. steroid taper. Given Z-Pak. Follow blood cultures. Sputum culture if patient is able to produce sputum, nebulizer, oxygen and inhalers.  3. Hypertension. Continue metoprolol ER.  4. Atrial fibrillation. Continue aspirin and metoprolol ER.  5. Deep vein thrombosis prophylaxis. Subcutaneous heparin t.i.d.  6. Gastroesophageal reflux disease. Continue ranitidine.    The above was discussed with the patient and the patient's daughter. The patient is a no code, DO NOT RESUSCITATE. She carries out of facility DO NOT RESUSCITATE form. She does carry her living will and her DO NOT RESUSCITATE papers.    TIME SPENT: 50 minutes.   ____________________________ Wylie HailSona A. Allena KatzPatel, MD sap:AT D: 07/18/2014 14:44:42 ET T: 07/18/2014 15:13:55 ET JOB#: [id number]  cc: Bevely Hackbart A. Allena KatzPatel, MD, <Dictator> Darlin PriestlyKenneth A. Lady GaryFath, MD Herbon E. Meredeth IdeFleming, MD Dale Durhamharlene Scott, MD Willow OraSONA A Markale Birdsell MD ELECTRONICALLY SIGNED 07/27/2014 15:13

## 2014-07-29 NOTE — Plan of Care (Signed)
Problem: Phase I Progression Outcomes Goal: O2 sats > or equal 90% or at baseline Outcome: Progressing Able to wean to 2L, tolerating, sats high 90's

## 2014-07-29 NOTE — Progress Notes (Signed)
Subjective:  I have shortness of breath  Objective:  Vital Signs in the last 24 hours: Temp:  [97.7 F (36.5 C)] 97.7 F (36.5 C) (05/01 0437) Pulse Rate:  [77] 77 (05/01 0437) Resp:  [20] 20 (05/01 0437) BP: (168)/(70) 168/70 mmHg (05/01 0437) SpO2:  [98 %-100 %] 98 % (05/01 0734) Weight:  [52.164 kg (115 lb)] 52.164 kg (115 lb) (05/01 0437)  Intake/Output from previous day:   Intake/Output from this shift:    Physical Exam: General appearance: mild distress  Lab Results:  Recent Labs  07/28/14 1002 07/29/14 0432  WBC 42.0* 32.2*  HGB 14.4 13.9  PLT 340 341    Recent Labs  07/28/14 1002 07/29/14 0432  NA  --  139  K  --  3.8  CL  --  90*  CO2 34* 38*  GLUCOSE  --  165*  BUN 22* 27*  CREATININE 0.76 0.91   No results for input(s): TROPONINI in the last 72 hours.  Invalid input(s): CK, MB Hepatic Function Panel  Recent Labs  07/28/14 1002  PROT 6.8  ALBUMIN 3.3*  AST 23  ALT 16  ALKPHOS 84   No results for input(s): CHOL in the last 72 hours. No results for input(s): PROTIME in the last 72 hours.  Imaging: Imaging results have been reviewed  Cardiac Studies:  Assessment/Plan:  Respiratory failure secondary to COPD and diastolic congestive heart failure  LOS: 1 day  #1 Atrial  fibrillation with a rapid ventricular rate, compensatory for respiratory failure, stable #2 Respiratory failure, multifactorial, secondary to COPD, pneumonia, diastolic congestive heart failure  #1 continue current medications #2 defer chronic anticoagulation #3 continue diuresis  Christine Bowen 07/29/2014, 9:39 AM

## 2014-07-29 NOTE — Discharge Summary (Signed)
PATIENT NAME:  Christine Bowen, Katriona M MR#:  [id number] DATE OF BIRTH:  [birth year]  DATE OF ADMISSION:  05/16/2014 DATE OF DISCHARGE:  05/20/2014  DISCHARGE DIAGNOSES: 1.  Healthcare-associated pneumonia.  2.  Chronic obstructive pulmonary disease exacerbation.  3.  Atrial fibrillation.  4.  Hypertension.  5.  Hypomagnesemia.  DISCHARGE MEDICATIONS: 1.  Advair Diskus 1 puff inhalation 2 times a day.  2.  Aspirin 325 mg oral tablet once a day.  3.  Spiriva 18 mcg once a day.  4.  Tylenol 325 mg oral tablet 2 tablets every 4 hours as needed for pain or temperature.  5.  Imodium 2 mg oral tablet every 4 hours as needed for diarrhea.  6.  Toprol XL 25 mg oral extended-release once a day.  7.  Zoloft 25 mg oral tablet once a day.  8.  Zantac 150 mg oral tablet once a day.  9.  Prednisone 10 mg oral tablet start at 60 and taper x 10 mg daily until complete.  10.  Augmentin 875/125 mg every 12 hours for 4 days.   DIET ON DISCHARGE: Low-sodium. Dietary supplement: Ensure.   DISCHARGE ACTIVITY: As tolerated.   TIMEFRAME TO FOLLOW:  In 1 to 2 weeks with primary care physician, Dr. Dale Durhamharlene Scott.   HISTORY OF PRESENT ILLNESS: The patient is an 81 year old Caucasian female who presented to the Emergency Department after calling for help at assisted living facility. She was unable to stand from commode and was found to be cachectic and hypoxic. Just recently started, for UTI, on ciprofloxacin. The patient also seemed to be confused the previous week, had some cough but there was no shortness of breath complaint. She was tachycardic by EMS arrival and also found to have hypoxia, placed on BiPAP, with initial 60% FiO2 to maintain saturation around 95, and so admitted to hospitalist service.   HOSPITAL COURSE AND STAY:  1.  Healthcare-associated pneumonia. She was living in assisted living facility, given Levaquin and vancomycin in the Emergency Department. We continued Zosyn, vancomycin and azithromycin.  Pulmonary consult was called in and she was weaned off from BiPAP, remained on 3 liters oxygen nasal cannula, which was her baseline at home, and we switched to Augmentin on discharge. There was also concern about her repeated weakness and fall so we got physical therapy and home health aide set up at her living place.  2.  Acute on chronic obstructive pulmonary disease. She was on IV steroid in the hospital. We changed to p.o. on discharge. Also nebulizer and inhalers were continued in the hospital.  3.  Hypertension. We continued metoprolol as she was taking at home.  4.  Atrial fibrillation. Rate was controlled with metoprolol. Not a candidate for long-term anticoagulation because of weakness. Continued aspirin. She was already having a pacemaker. Cardiology consult was called in and they agreed with the plan.  5.  Urinary tract infection. This was suspected but later on ruled out with negative urine culture, and we discharged her Augmentin for pneumonia anyways.  6.  Glucose intolerance. Her HbA1c was 5.1 so no further intervention needed.  7.  Elevated troponin, probably demand ischemia because of infection. Cardiology consult was called in. No further recommendations for intervention so continued management.   CONSULTANTS IN HOSPITAL: Dr. Meredeth IdeFleming for pulmonology and Dr. Darrold JunkerParaschos for cardiology.   DIAGNOSTIC DATA: Important labs in the hospital: Magnesium 1.6. Phosphorus 3.6. WBC count 28.8, hemoglobin 14.5, platelet count 333,000 and MCV 88.   Glucose 223, BUN 15,  creatinine 0.9, sodium 140, potassium 4, chloride 103, CO2 27, calcium 9.2, bilirubin 0.6.   Troponin 0.07.   BNP level 565.   Blood culture was negative on admission.   ABG: PH 7.31, pCO2 56, pO2 112, FiO2 60.   Urinalysis: Positive 24 WBC, 1+ leukocyte esterase.   Chest x-ray, portable, single view was done which showed left lower lobe infiltrate, superimposed severe COPD.   Urine culture was negative.   Troponin was  0.28, which went up to 0.31. On followup, TSH was normal at 1.   On further followup, her WBC count came down to 12.6 and hemoglobin 12.4.   TOTAL TIME SPENT ON THIS DISCHARGE: 40 minutes. ____________________________ Hope Pigeon Elisabeth Pigeon, MD vgv:sb D: 05/24/2014 08:34:46 ET T: 05/24/2014 16:02:54 ET JOB#: [id number]  cc: Hope Pigeon. Elisabeth Pigeon, MD, <Dictator> Dale Stafford [place], MD Marcina Millard, MD Herbon E. Meredeth Ide, MD  [facility] MD ELECTRONICALLY SIGNED 06/11/2014 12:52

## 2014-07-29 NOTE — Discharge Summary (Signed)
PATIENT NAME:  Erroll LunaBLALOCK, Khadejah M MR#:  [id number] DATE OF BIRTH:  1922/09/13  DATE OF ADMISSION:  07/18/2014 DATE OF DISCHARGE:  07/19/2014  ADMISSION DIAGNOSES:  1. Acute chronic obstructive pulmonary disease exacerbation.  2. Rapid atrial fibrillation now with controlled heart rate, with a history of chronic atrial fibrillation.  3. Elevated troponin due to demand ischemia.   CONSULTATIONS: None.   PHYSICAL EXAMINATION AT DISCHARGE:  VITAL SIGNS: The patient is afebrile with a temperature 97.5, pulse is 71, respirations 20, blood pressure 136/84, 93% on 3 liters, which is her baseline.  GENERAL: The patient is alert and oriented, not in acute distress.  LUNGS: Clear to auscultation with minimal wheezing bilaterally. No rhonchi or rales are heard. No dullness to percussion.  ABDOMEN: Bowel sounds are positive. Nontender, nondistended. No hepatosplenomegaly.  CARDIOVASCULAR: Irregularly irregular without any murmurs, gallops, or rubs.  LABORATORY DATA: Troponin max is 0.06. Troponin at discharge 0.03.   IMAGING STUDIES: Chest x-ray shows no acute cardiopulmonary disease.   HOSPITAL COURSE: This is a very pleasant 81 year old female, who presented from [facility] with shortness of breath. For further details, please refer to the H and P.   1. Acute on chronic respiratory failure secondary to chronic obstructive pulmonary disease exacerbation. The patient was placed on IV steroids, Z-Pak and blood cultures have been ordered. Blood culture is negative to date. Her chronic obstructive pulmonary disease exacerbation has much improved. She has minimal wheezing. She is on her 3 liters of oxygen, which she wears chronically.  2. Atrial fibrillation. Apparently, her heart rate was elevated. It is well-controlled. She will continue on aspirin and metoprolol.  3. Essential hypertension. The patient will continue metoprolol.  4. Elevated troponin due to demand ischemia. Her max troponin was 0.06  with normal CKs. This is not acute coronary syndrome, but demand ischemia from her tachycardia and respiratory issues.   DISCHARGE MEDICATIONS:  1. Azithromycin 500 mg 2 daily.  2. Prednisone taper starting at 60 mg, taper by 10 mg every 2 days.  3. Advair Diskus 250/50 b.i.d.  4. Spiriva 18 mcg daily.  5. Q-Tussin 5 mL every 6 hours p.r.n.  6. Ranitidine 150 daily.  7. Zoloft 25 mg daily.  8. Loperamide 2 mg every 4 hours p.r.n.  9. Tylenol 325 mg 2 tablets every 6 hours p.r.n. pain.  10. Meclizine 25 mg t.i.d. p.r.n.  11. Align 4 mg daily.  12. Aspirin 325 mg daily.  13. Ventolin 2 puffs every 6 hours p.r.n.  14. Metoprolol 25 mg daily.   DISCHARGE DIET: Low sodium diet.   DISCHARGE ACTIVITY: As tolerated.   DISCHARGE FOLLOWUP: The patient will need to follow up with her primary care physician in 2 weeks.   TIME SPENT:  Approximately 35 minutes.   The patient was stable for discharge. Plan of care was discussed with the patient and daughter.  ____________________________ Janyth ContesSital P. Juliene PinaMody, MD spm:AT D: 07/19/2014 11:10:03 ET T: 07/19/2014 11:49:15 ET JOB#: [id number]  cc: Angala Hilgers P. Juliene PinaMody, MD, <Dictator> Dale Durhamharlene Scott, MD Janyth ContesSITAL P Ziana Heyliger MD ELECTRONICALLY SIGNED 07/19/2014 15:12

## 2014-07-29 NOTE — Care Management (Signed)
Patient presents from [facility].  She does have chronic home 02.  Clinical social work referral entered

## 2014-07-29 NOTE — Consult Note (Signed)
PATIENT NAME:  Christine Bowen, Christine Bowen MR#:  [id number] DATE OF BIRTH:  Aug 27, 1922  DATE OF CONSULTATION:  05/19/2014  REFERRING PHYSICIAN:   CONSULTING PHYSICIAN:  Yevonne PaxSaadat A. Khan, MD  REASON FOR CONSULTATION: Acute on chronic respiratory failure actinic damage pneumonia.   HISTORY OF PRESENT ILLNESS: An 81 year old female, who normally sees Dr. Meredeth IdeFleming, presented to the hospital with increasing cough and congestion. The patient was having a lot of difficulty with shortness of breath. She had recently been treated for a UTI with Cipro, and the patient had been more confused and agitated, also. She does have a history of COPD. When she  presented to the Emergency Room, she was noted to have significant desaturations, was started on BiPAP and required about 60% FiO2. She did have a chest x-ray, which showed left lower lobe pneumonia.   PAST MEDICAL HISTORY: Significant for COPD, hypertension, Atrial fibrillation.   PAST SURGICAL HISTORY: She has had pacer placed, cholecystectomy, appendectomy.   SOCIAL HISTORY: Currently, does not smoke. Quit about 30 years ago. No alcohol or drug use.   FAMILY HISTORY: Positive for coronary disease.   MEDICATIONS: Reviewed on the electronic medical record.   ALLERGIES: PREDNISONE, LIPITOR, PROTONIX, AND ZOCOR.   REVIEW OF SYSTEMS: She was somnolent, not able to provide a full review of systems.   PHYSICAL EXAMINATION:  VITAL SIGNS: At the time that she was seen: Temperature was 97.3, pulse 77, respiratory rate 18, blood pressure 134/72, saturations were 96%, appeared to be comfortable.  NECK: Supple. There was no JVD. No adenopathy. No thyromegaly.   CHEST: Coarse breath sounds. There were no rales or rhonchi. Expansion was equal, overall diminished, though.  CARDIOVASCULAR: S1, S2 is normal. Regular rhythm. No gallop or rub.  ABDOMEN: Soft, nontender.  NEUROLOGICAL: We are not able to assess, as she is not cooperative.  SKIN: Chronic venous stasis changes in  the lower extremities. No edema was noted.  MUSCULOSKELETAL: Showed no active synovitis.   LABORATORY RESULTS: Chest x-ray: There was left lower lobe infiltrate with severe COPD and possibility of a very small effusion. She had an admission ABG done that was showing pH 7.3, pCO2 of 56, pO2 of 112. The patient's other laboratories showed white count 12.6, hemoglobin 12.4, hematocrit 37.5. Chemistry that was done revealed a BUN of 15, creatinine 0.9, glucose 223, sodium 140, potassium 4.0.   IMPRESSION: 1. Acute on chronic hypoxemic respiratory failure.  2. Pneumonia, healthcare-associated with involvement of the left lower lobe.  3. Chronic obstructive pulmonary disease.   PLAN: She is admitted. She has been started on antibiotics. She is allergic to PREDNISONE, so we will try to give Solu-Medrol. Continue with aggressive bronchodilators. No need for a thoracentesis at this stage. We will monitor her effusions and do a thoracentesis if necessary if the effusion increases.   Thank you for consulting me in the care of this patient. Prognosis remains guarded at this time.    ____________________________ Yevonne PaxSaadat A. Khan, MD sak:mw D: 05/19/2014 10:35:22 ET T: 05/19/2014 12:42:43 ET JOB#: [id number]  cc: Yevonne PaxSaadat A. Khan, MD, <Dictator> Yevonne PaxSAADAT A KHAN MD ELECTRONICALLY SIGNED 05/20/2014 12:35

## 2014-07-29 NOTE — Consult Note (Signed)
PATIENT NAME:  Christine Bowen, Christine Bowen MR#:  [id number] DATE OF BIRTH:  [birth year]  DATE OF CONSULTATION:  05/16/2014  REFERRING PHYSICIAN:   CONSULTING PHYSICIAN:  Marcina MillardAlexander Mailin Coglianese, MD  PRIMARY CARE PHYSICIAN: Dale Durhamharlene Scott, MD   CARDIOLOGIST:  Darlin PriestlyKenneth A. Lady GaryFath, MD   CHIEF COMPLAINT: Shortness of breath.   REASON FOR CONSULTATION: Consultation requested for evaluation of borderline elevated troponin.   HISTORY OF PRESENT ILLNESS: The patient is an 81 year old female with history of COPD on chronic oxygen therapy, with hypertension, and hyperlipidemia. The patient has a history of sick sinus syndrome, status post dual-chamber pacemaker. She currently is a resident in an assisted living facility and was noted to be short of breath, confused, and weak. She was brought to [facility] Emergency Room, where she was noted to be hypoxic and placed on BiPAP. Chest x-ray revealed left lower lobe pneumonia. Admission labs were notable for borderline elevated troponin of 0.31. According to the daughter, the patient had not been complaining of any chest pain. EKG reveals atrial fibrillation with right bundle branch block.   PAST MEDICAL HISTORY: 1. Atrial fibrillation.  2. COPD.  3. Hypertension.  4. Hyperlipidemia.  5. Sick sinus syndrome, status post dual-chamber pacemaker.   MEDICATIONS ON ADMISSION: Metoprolol succinate 25 mg daily, Align 4 mg capsule 1 daily, cyanocobalamin 1000 mg injection monthly, Advair Diskus 1 puff q. 12, Mucinex q. 4 hours p.r.n., magnesium hydroxide 30 mL nightly p.r.n., ranitidine 150 mg daily, sertraline 25 mg daily, Spiriva 18 mcg inhalation capsule daily, albuterol inhaler q. 6 hours p.r.n.   SOCIAL HISTORY: The patient quit smoking 30 years ago. She is a resident in an assisted living facility.   FAMILY HISTORY: Mother, father, and brother are all status post MI.  REVIEW OF SYSTEMS: Not obtainable. The patient was sedated for agitation.   PHYSICAL EXAMINATION: VITAL SIGNS:  Blood pressure 176/75, pulse 63, respirations 18, temperature 97.8, pulse oximetry 92%.  HEENT: Pupils equal, reactive to light and accommodation.  NECK: Supple without thyromegaly.  LUNGS: Revealed decreased breath sounds in both bases.  CARDIOVASCULAR:  Normal JVP. Normal PMI. Regular rate and rhythm. Normal S1, S2. No appreciable gallop, murmur, or rub.  ABDOMEN: Soft and nontender. Pulses were intact bilaterally.  MUSCULOSKELETAL: Normal muscle tone.  NEUROLOGIC: The patient was somnolent and sedated. Motor and sensory appear grossly intact.   IMPRESSION: An 81 year old female, currently a resident in assisted living, who presents with pneumonia with associated shortness of breath and weakness. The patient has borderline elevated troponin, likely due to demand supply ischemia and not due to acute coronary syndrome.   RECOMMENDATIONS: 1. Agree with overall current therapy.  2. Would defer full dose anticoagulation.  3. Consider 2-D echocardiogram to evaluate left ventricular function.  4. Further recommendations pending the patient's initial clinical course.     ____________________________ Marcina MillardAlexander Stryker Veasey, MD ap:mw D: 05/16/2014 18:51:09 ET T: 05/16/2014 19:50:26 ET JOB#: [id number]  cc: Marcina MillardAlexander Anaalicia Reimann, MD, <Dictator> Marcina MillardALEXANDER Sigmund Morera MD ELECTRONICALLY SIGNED 05/22/2014 14:38

## 2014-07-30 ENCOUNTER — Telehealth: Payer: Self-pay | Admitting: Internal Medicine

## 2014-07-30 LAB — CULTURE, BLOOD (SINGLE)

## 2014-07-30 LAB — GLUCOSE, CAPILLARY
GLUCOSE-CAPILLARY: 184 mg/dL — AB (ref 70–99)
GLUCOSE-CAPILLARY: 190 mg/dL — AB (ref 70–99)
Glucose-Capillary: 161 mg/dL — ABNORMAL HIGH (ref 70–99)
Glucose-Capillary: 190 mg/dL — ABNORMAL HIGH (ref 70–99)

## 2014-07-30 NOTE — Progress Notes (Signed)
SLP Note Bedside swallow eval completed. Report to follow. No s/s of aspiration with any tested. Rec mechanical soft foods. Add moisture to dry foods.

## 2014-07-30 NOTE — Evaluation (Signed)
Clinical/Bedside Swallow Evaluation Patient Details  Name: Christine Bowen MRN: [id number] Date of Birth: 05/11/1922  Today's Date: 07/30/2014 Time:        Past Medical History:  Past Medical History  Diagnosis Date  . COPD (chronic obstructive pulmonary disease)   . Hypertension   . Hypercholesterolemia   . Diverticulosis   . Hyperglycemia    Past Surgical History:  Past Surgical History  Procedure Laterality Date  . Tonsillectomy  1861  . Cholecystectomy  1978  . Wrist surgery  1988    removal of a growth   HPI:      Assessment / Plan / Recommendation Clinical Impression  Bedside swallow eval revealed no s/s of aspiration with any tested consistency. Oral transit delay with solids, mild oral residue after the swallow. Vocal quality remained clear, but weak, laryngeal elevation appeared adequate. Rec mech soft diet with thin liquids. SLP to f/u witht oleration of diet.    Aspiration Risk  Mild    Diet Recommendation Dysphagia 3 (Mech soft)   Medication Administration: Whole meds with liquid    Other  Recommendations Oral Care Recommendations: Oral care BID   Follow Up Recommendations       Frequency and Duration    1 week   Pertinent Vitals/Pain None stated    SLP Swallow Goals     Swallow Study Prior Functional Status       General Date of Onset: 07/28/14 Type of Study: Bedside swallow evaluation Previous Swallow Assessment: None known Diet Prior to this Study: Regular Respiratory Status: Supplemental O2 delivered via (comment) History of Recent Intubation: No Behavior/Cognition: Alert Oral Cavity - Dentition: Adequate natural dentition/normal for age Self-Feeding Abilities: Needs assist Patient Positioning: Upright in bed Baseline Vocal Quality: Hoarse;Low vocal intensity Volitional Cough: Weak Volitional Swallow: Able to elicit    Oral/Motor/Sensory Function Overall Oral Motor/Sensory Function: Appears within functional limits for tasks  assessed   Ice Chips Ice chips: Within functional limits   Thin Liquid Thin Liquid: Within functional limits Presentation: Cup;Self Fed;Spoon;Straw    Nectar Thick     Honey Thick     Puree Puree: Within functional limits Presentation: Spoon   Solid   GO    Solid: Within functional limits Presentation: Self Fed Other Comments: mild oral transit delay, dry oral cavity       Eather ColasJennifer A Letroy Vazguez 07/30/2014,12:32 PM

## 2014-07-30 NOTE — Consult Note (Addendum)
PATIENT NAME:  Christine Bowen, Christine Bowen MR#:  [id number] DATE OF BIRTH:  May 30, 1922  DATE OF CONSULTATION:  07/28/2014  REFERRING PHYSICIAN:  Aram BeechamJeffrey Sparks, MD CONSULTING PHYSICIAN:  Marcina MillardAlexander Brigit Doke, MD  PRIMARY CARE PHYSICIAN:  Dale Durhamharlene Scott, MD  CHIEF COMPLAINT: Shortness of breath.   REASON FOR CONSULTATION: Consultation requested for evaluation of congestive heart failure and atrial fibrillation.   HISTORY OF PRESENT ILLNESS: The patient is an 81 year old female, currently a resident at the [facility] with history of COPD and chronic respiratory failure, as well as history of chronic atrial fibrillation and sick sinus syndrome status post pacemaker. The patient was hospitalized approximately 9 days ago for COPD exacerbation and bronchitis. The patient was discharged to [facility] where she has been experiencing recurrent, worsening shortness of breath. In the Emergency Room, the patient was tachypneic, hypoxic, with chest x-ray not revealing obvious pneumonia. The patient was in atrial fibrillation with a rapid ventricular rate. Admission laboratories were notable for a troponin of 0.07 in the absence of chest pain. The patient had markedly elevated white count of 42,000.   PAST MEDICAL HISTORY: 1. O2 dependent COPD.  2. Chronic atrial fibrillation.  3. Sick sinus syndrome, status post pacemaker in February 2016.  4. Hypertension.   MEDICATIONS: Aspirin 325 mg daily, Toprol-XL 25 mg daily, Ventolin inhaler 2 puffs q. 6 p.r.n., Spiriva 1 capsule daily, Zoloft 25 mg daily, Zantac 150 mg daily, meclizine 25 mg t.i.d. p.r.n., Advair 250/50 one puff b.i.d., loperamide 2 mg q. 4 hours p.r.n.   SOCIAL HISTORY: The patient currently is a resident to [facility]. She denies tobacco abuse.   FAMILY HISTORY: Positive for coronary artery disease.   REVIEW OF SYSTEMS:  CONSTITUTIONAL: No fever or chills.  EYES: No blurry vision.  EARS: No hearing loss.  RESPIRATORY:  Shortness of breath as described above.  CARDIOVASCULAR: No chest pain.  GASTROINTESTINAL: No nausea, vomiting, or diarrhea.  GENITOURINARY: No dysuria or hematuria.  ENDOCRINE: No polyuria or polydipsia.  MUSCULOSKELETAL: No arthralgias or myalgias.  NEUROLOGICAL: No focal muscle weakness or numbness.  PSYCHOLOGICAL: No depression or anxiety.   PHYSICAL EXAMINATION: VITAL SIGNS: Blood pressure 126/82, pulse 123 and irregularly irregular, respirations 18, temperature 97.7, pulse oximetry 98%.  HEENT: Pupils equally reactive to light and accommodation.  NECK: Supple without thyromegaly.  PULMONARY: Lungs revealed diffuse coarse rhonchi throughout all lung fields.  HEART: Normal JVP. Normal PMI.  Irregularly irregular rhythm. Normal S1, S2. No appreciable gallop, murmur, or rub.  ABDOMEN: Soft and nontender. Pulses were intact bilaterally. There was no cyanosis, clubbing, or edema.  MUSCULOSKELETAL: Normal muscle tone.  NEUROLOGICAL: The patient was alert and oriented but in obvious respiratory distress.   IMPRESSION: An 81 year old female with chronic obstructive pulmonary disease, O2 dependent, recent hospitalization for chronic obstructive pulmonary disease exacerbation and bronchitis, who returns with respiratory distress, markedly elevated white count consistent with recurrent chronic obstructive pulmonary disease flare/pneumonia/sepsis. EKG does reveal atrial fibrillation with a rapid ventricular rate likely compensatory for respiratory failure.   RECOMMENDATIONS: 1. Agree with overall current therapy.  2. Would defer full dose anticoagulation at this time.  3. Furosemide 40 mg IV push, q. 12 hours p.r.n. for respiratory distress and heart failure.  4. Morphine 1 mg IV q. 6 hours p.r.n. for respiratory distress/CHF.  5. Further recommendations pending  patient's initial clinical course.     ____________________________ Marcina MillardAlexander Raejean Swinford, MD ap:tr D: 07/28/2014 14:13:31  ET T: 07/28/2014 16:22:38 ET JOB#: [id number]  cc: Lyn HollingsheadAlexander  Theophil Thivierge, MD, <Dictator>

## 2014-07-30 NOTE — Clinical Social Work Note (Signed)
Clinical Social Work Assessment  Patient Details  Name: Christine Bowen MRN: [id number] Date of Birth: 07/11/1922  Date of referral:  07/30/14               Reason for consult:  Facility Placement (From a facility [facility].)                Permission sought to share information with:  Other Permission granted to share information::  Yes, Verbal Permission Granted  Name::        Agency::  [facility]  Relationship::  Daughter Christine Bowen  Contact Information:     Housing/Transportation Living arrangements for the past 2 months:  Assisted Living Facility Source of Information:  Patient, Other (Comment Required) ([facility] (ALF Facility) also confirmed pt was resident.) Patient Interpreter Needed:  None Criminal Activity/Legal Involvement Pertinent to Current Situation/Hospitalization:  No - Comment as needed Significant Relationships:  Adult Children Lives with:  Facility Resident Do you feel safe going back to the place where you live?  Yes Need for family participation in patient care:  No (Coment)  Care giving concerns: none  Office managerocial Worker assessment / plan:  Plan is currently to DC pt back to [facility] Employment status:  Retired Health and safety inspectornsurance information:  Medicare PT Recommendations:  Skilled Nursing Facility Information / Referral to community resources:     Patient/Family's Response to care:  Will contact family in the morning.  Pt was oriented and competent enough to answer her own question   Patient/Family's Understanding of and Emotional Response to Diagnosis, Current Treatment, and Prognosis:  Will confirm tomorrow.  Emotional Assessment Appearance:  Appears stated age Attitude/Demeanor/Rapport:    Affect (typically observed):    Orientation:  Oriented to Self Alcohol / Substance use:    Psych involvement (Current and /or in the community):  No (Comment)  Discharge Needs  Concerns to be addressed:  Other (Comment  Required (Return to Facility) Readmission within the last 30 days:    Current discharge risk:  Dependent with Mobility Barriers to Discharge:  Other (Not currently medically stable.)   Chauncy PassyBennerson, Jacqulyn Barresi J, LCSW 07/30/2014, 5:41 PM

## 2014-07-30 NOTE — Progress Notes (Signed)
Dr Esaw GrandchildVachanni saw Ms Christine Bowen and has put in a PT consult for recommendation for rehab or home with home health. Ms Christine Bowen is currently open to [facility] for PT and RN. Relatives in room stated that Ms Christine Bowen is currently involved in a Medicaid appeal to authorize funding for rehab. Ms Christine Bowen has Medicaid and Medicare insurance.

## 2014-07-30 NOTE — Care Management Note (Addendum)
Case Management Note  Patient Details  Name: Erroll Lunavelyn M Mccart MRN: [id number] Date of Birth: 09/05/1922  Subjective/Objective:                    Action/Plan:    Pending discharge to [facility]   Expected Discharge Date:   undecided               Expected Discharge Plan:  Assisted Living / Rest Home  In-House Referral:  Clinical Social Work  Discharge planning Services  CM Consult  Post Acute Care Choice:  Durable Medical Equipment Choice offered to:     DME Arranged:    DME Agency:     HH Arranged:    HH Agency:     Status of Service:     Medicare Important Message Given:   YES Date Medicare IM Given:   07/30/14 Medicare IM give by:   Tekeya Geffert, CM Date Additional Medicare IM Given:    Additional Medicare Important Message give by:     If discussed at Long Length of Stay Meetings, dates discussed:    Additional Comments:  Ercel Pepitone A, RN 07/30/2014, 10:19 AM

## 2014-07-30 NOTE — Telephone Encounter (Signed)
Hospital records are now visible in epic.

## 2014-07-30 NOTE — Plan of Care (Signed)
Problem: SLP Dysphagia Goals Goal: Misc Dysphagia Goal Pt will safely tolerate po diet of least restrictive consistency w/ no overt s/s of aspiration noted by Staff/pt/family x3 sessions.    

## 2014-07-30 NOTE — H&P (Signed)
PATIENT NAME:  Christine Bowen, Christine Bowen MR#:  [id number] DATE OF BIRTH:  Nov 07, 1922  DATE OF ADMISSION:  07/28/2014  REFERRING PHYSICIAN:  Glennie Isle, MD  FAMILY PHYSICIAN: Dale Riddleville, MD  REASON FOR ADMISSION: Acute on chronic respiratory failure.   HISTORY OF PRESENT ILLNESS: The patient is an 81 year old female who resides at [facility] with a history of COPD and chronic respiratory failure on oxygen. Also has a history of chronic atrial fibrillation. Was hospitalized 9 days ago with acute COPD exacerbation and presumed bronchitis. Was discharged back to [facility]. Presents now with worsening shortness of breath. In the Emergency Room, the patient was profoundly tachypneic and more hypoxic. Chest x-ray reveals no obvious pneumonia. She is in rapid atrial fibrillation. She is now admitted for further evaluation.   PAST MEDICAL HISTORY: 1. Chronic obstructive pulmonary disease.  2. Chronic respiratory failure, on oxygen.  3. Chronic atrial fibrillation.  4. Sick sinus syndrome, status post pacemaker implant.  5. Benign hypertension.  6. Status post appendectomy.  7. Status post cholecystectomy.   MEDICATIONS: 1. Ventolin 2 puffs q. 6 hours p.r.n.  2. Toprol-XL 25 mg p.o. daily.  3. Spiriva 1 capsule inhaled daily.  4. Zoloft 25 mg p.o. daily.  5. Zantac 150 mg p.o. daily.  6. Meclizine 25 mg p.o. t.i.d. as needed for dizziness.  7. Aspirin 325 mg p.o. daily.  8. Advair 250/50 one puff b.i.d. 9. Align  4 mg p.o. daily.  10. Loperamide 2 mg every 4 hours as needed for diarrhea.   ALLERGIES: LIPITOR, ZOCOR, AND PROTONIX.   SOCIAL HISTORY: Negative for alcohol or tobacco abuse.   FAMILY HISTORY: Positive coronary artery disease, but otherwise unremarkable.   REVIEW OF SYSTEMS: CONSTITUTIONAL: No fever or change in weight.  EYES: No blurred or double vision. No glaucoma.  ENT: No tinnitus or hearing loss. No nasal discharge or bleeding. No difficulty swallowing.   RESPIRATORY: The patient has had wheezing and cough and shortness of breath. No hemoptysis. No painful respiration.  CARDIOVASCULAR: No chest pain or palpitations. No syncope.  GASTROINTESTINAL: The patient has had nausea but no vomiting or diarrhea. No abdominal pain.  GENITOURINARY: No dysuria or hematuria. No incontinence.  ENDOCRINE: No polyuria or polydipsia. No heat or cold intolerance.  HEMATOLOGIC: The patient denies anemia or easy bruising. No bleeding.  LYMPHATIC: No swollen glands.  MUSCULOSKELETAL: The patient denies pain in her neck, shoulders, knees, or hips. Some  back pain. No gout.  NEUROLOGIC: No numbness or migraines. Denies stroke or seizures.  PSYCHOLOGICAL: The patient denies anxiety, insomnia, or depression.   PHYSICAL EXAMINATION: GENERAL: The patient is acutely ill-appearing, in moderate respiratory distress.  VITAL SIGNS: Are currently remarkable for a blood pressure of 162/91 with a heart rate of 106, respiratory rate of 24, temperature of 97.5, saturation 98% on 4 liters.  HEENT: Normocephalic, atraumatic. Pupils equally round and reactive to light and accommodation. Extraocular movements are intact. Sclerae are not icteric. Conjunctivae are clear.  OROPHARYNX: Dry, but clear.  NECK: Supple without JVD. No adenopathy or thyromegaly was noted.  LUNGS: Reveal diffuse wheezes and rhonchi. Basilar crackles are also noted. No dullness. Respiratory effort is increased.  CARDIAC: Is an irregularly irregular rhythm with a rapid rate. No significant rubs or gallops. PMI is nondisplaced. Chest wall is nontender.  ABDOMEN: Soft, nontender, with normoactive bowel sounds. No organomegaly or masses were appreciated. No hernias or bruits were noted.  EXTREMITIES: Without clubbing, cyanosis, or edema. Stasis changes were noted. Distal  pulses are 1+ bilaterally.  SKIN: Was warm and dry without rash or lesions.  NEUROLOGIC: Revealed cranial nerves II through XII grossly intact. Deep  tendon reflexes were symmetric. Motor and sensory exam is nonfocal.  PSYCHIATRIC: Revealed a patient who is alert and oriented to person, place, and time. She was cooperative and used good judgment.   LABORATORY DATA AND IMAGING: EKG revealed rapid atrial fibrillation with no acute ischemic changes. Chest x-ray revealed COPD and fibrosis, but no edema or consolidation. ABG on 4 liters revealed a pH of 7.38 with a pCO2 of 61 and a pO2 of 98 with a saturation of 99%. Her white count was 42.0 with a hemoglobin of 14.4. Troponin was 0.07. Glucose 179 with a BUN of 22, creatinine of 0.76 and a potassium of 2.8.   ASSESSMENT: 1. Acute on chronic respiratory failure.  2. Chronic obstructive pulmonary disease exacerbation.  3. Presumed bronchitis.  4. Rapid atrial fibrillation.  5. Hypokalemia.  6. Hyperglycemia.  7. Elevated troponin due to demand ischemia.   PLAN: The patient will be admitted to telemetry as a DO NOT RESUSCITATE according to the family wishes. She will be placed on oxygen with IV fluids, IV steroids, IV antibiotics, and  DuoNeb SVNs. We will supplement her potassium at this time. We will follow serial cardiac enzymes and obtain an echocardiogram. We will consult both cardiology and pulmonology. Repeat a blood gas in 1-2 hours. Follow up routine labs and a chest x-ray in the morning. We will follow her sugars while on steroids. Further treatment and evaluation will depend upon the patient's progress.   TOTAL TIME SPENT ON THIS PATIENT: Was 55 minutes.     ____________________________ Duane LopeJeffrey D. Judithann SheenSparks, MD jds:tr D: 07/28/2014 11:59:59 ET T: 07/28/2014 12:33:44 ET JOB#: [id number]  cc: Duane LopeJeffrey D. Judithann SheenSparks, MD, <Dictator> Dale Durhamharlene Scott, MD Neale Marzette Rodena Medin Mirissa Lopresti MD ELECTRONICALLY SIGNED 07/28/2014 14:15

## 2014-07-30 NOTE — Care Management Note (Signed)
Case Management Note  Patient Details  Name: Christine Bowen MRN: [id number] Date of Birth: 12/04/1922  Subjective/Objective:                    Action/Plan:   Expected Discharge Date:                  Expected Discharge Plan:  Assisted Living / Rest Home  In-House Referral:  Clinical Social Work  Discharge planning Services  CM Consult  Post Acute Care Choice:  Durable Medical Equipment Choice offered to:     DME Arranged:    DME Agency:     HH Arranged:    HH Agency:     Status of Service:     Medicare Important Message Given:    Date Medicare IM Given:    Medicare IM give by:    Date Additional Medicare IM Given:    Additional Medicare Important Message give by:     If discussed at Long Length of Stay Meetings, dates discussed:    Additional Comments:  Arsen Mangione A, RN 07/30/2014, 1:19 PM

## 2014-07-30 NOTE — Progress Notes (Signed)
Patient is alert and oriented this shift. Denied any pain. No acute respiratory distress noted. Remain NSR on the heart monitor.

## 2014-07-30 NOTE — Care Management Note (Signed)
Case Management Note  Patient Details  Name: Erroll Lunavelyn M Menken MRN: [id number] Date of Birth: 10/21/1922  Subjective/Objective:                    Action/Plan:   Expected Discharge Date:                  Expected Discharge Plan:  Assisted Living / Rest Home  In-House Referral:  Clinical Social Work  Discharge planning Services  CM Consult  Post Acute Care Choice:  Durable Medical Equipment Choice offered to:     DME Arranged:    DME Agency:     HH Arranged:    HH Agency:     Status of Service:     Medicare Important Message Given:   YES Date Medicare IM Given:   07/29/04 Medicare IM give by:   Srihith Aquilino, CM Date Additional Medicare IM Given:    Additional Medicare Important Message give by:     If discussed at Long Length of Stay Meetings, dates discussed:    Additional Comments:  Solei Wubben A, RN 07/30/2014, 11:13 AM

## 2014-07-30 NOTE — Progress Notes (Signed)
Christine Bowen PROGRESS NOTE  DOT SPLINTER QIO:[id number] DOB: [birth year] DOA: 07/28/2014 PCP: Dale Bridgeville, MD  HPI/Subjective: Pt states she is feeling about the same today.  Daughter is in the room with her and says she feels the patient is breathing a little easier.  Pt complains of hoarseness, and daughter states that she has had this hoarseness for several weeks.  ROS: Constitutional: No fevers/chills, fatigue, weakness Eyes: No blurred or double vision, pain, redness ENT: Hoarseness, no ear pain, hearing loss, difficulty swallowing Resp: Wheeze, cough, improving dyspnea, no hemoptysis Cardio-vascular: No chest pain, orthopnea, edema, palpitations, syncope GI: No nausea/vomiting/diarrhea, abdominal pain, constipation GU: No dysuria, hematuria, frequency Endocrine: No nocturia, thyroid problems, heat or cold intolerance Hematologic/Lymphatic: No anemia, easy bruising bleeding, swollen glands Integumentary: No acne, rash, lesions Musculoskeletal: No acute arthritis, joint swelling, gout Neuro: No numbness, weakness, headache Psych: No anxiety, insomnia, depression  Objective:  Intake/Output Summary (Last 24 hours) at 07/30/14 2128 Last data filed at 07/30/14 2051  Gross per 24 hour  Intake   1140 ml  Output    400 ml  Net    740 ml    Exam:   Constitutional: Filed Vitals:   07/30/14 1417 07/30/14 1657 07/30/14 1938 07/30/14 2003  BP: 162/69  179/82   Pulse: 63 70 60   Temp: 97.5 F (36.4 C)  98.2 F (36.8 C)   TempSrc: Oral  Oral   Resp:   19   Weight:      SpO2: 98%  100% 96%   Wt Readings from Last 3 Encounters:  07/30/14 115 lb (52.164 kg)  06/28/14 123 lb 12.8 oz (56.155 kg)  05/31/14 127 lb 4 oz (57.72 kg)   General:  Well dressed, well nourished, in no apparent distress HEENT: PERRL, EOMI, no scleral icterus, no conjunctivitis, no difficulty hearing Neck: supple, no masses, non tender, no cervical adenopathy, no JVD, thyroid not  enlarged Cardiovascular: RRR, no m/r/g, no S3/S4, good pedal pulses, no LE edema. Respiratory: diffuse bilateral wheeze, no rales, no ronchi, breath sounds not diminished, no increased respiratory effort Abdomen: soft, nontender, nondistended, no mass, bowel sounds present Musculoskeletal: 4/5 muscular strength x4 extremities, full spontaneous range of motion throughout, no cyanosis/clubbing Skin: no rash, no lesions, bilateral LE ecchymosis, warm and dry  Lymphatic: No adenopathy Neurologic: Cranial nerves intact, sensation intact, no dysarthria, no aphasia Psychiatric: Alert, Oriented to time, person, place, circumstance, cooperative, not confused, not agitated, not depressed    Data Reviewed: Basic Metabolic Panel:  Recent Labs Lab 07/28/14 1002 07/29/14 0432  NA 136 139  K 2.8* 3.8  CL 91* 90*  CO2 34* 38*  GLUCOSE 179* 165*  BUN 22* 27*  CREATININE 0.76 0.91  CALCIUM 9.1 8.9  MG  --  1.8   Liver Function Tests:  Recent Labs Lab 07/28/14 1002  AST 23  ALT 16  ALKPHOS 84  PROT 6.8  ALBUMIN 3.3*   No results for input(s): LIPASE, AMYLASE in the last 168 hours. No results for input(s): AMMONIA in the last 168 hours. CBC:  Recent Labs Lab 07/28/14 1002 07/29/14 0432  WBC 42.0* 32.2*  HGB 14.4 13.9  HCT 44.6 42.0  MCV 87 86.8  PLT 340 341   Cardiac Enzymes: No results for input(s): CKTOTAL, CKMB, CKMBINDEX, TROPONINI in the last 168 hours. BNP (last 3 results) No results for input(s): BNP in the last 8760 hours.  ProBNP (last 3 results) No results for input(s): PROBNP in the last 8760 hours.  CBG:  Recent Labs Lab 07/29/14 2005 07/30/14 0757 07/30/14 1141 07/30/14 1719 07/30/14 1937  GLUCAP 175* 161* 184* 190* 190*    Recent Results (from the past 240 hour(s))  Culture, blood (single)     Status: None (Preliminary result)   Collection Time: 07/28/14 11:31 AM  Result Value Ref Range Status   Micro Text Report   Preliminary       COMMENT                    NO GROWTH IN 48 HOURS   ANTIBIOTIC                                                      Culture, blood (single)     Status: None (Preliminary result)   Collection Time: 07/28/14 11:31 AM  Result Value Ref Range Status   Micro Text Report   Preliminary       COMMENT                   NO GROWTH IN 48 HOURS   ANTIBIOTIC                                                         Studies: Dg Chest 2 View  07/29/2014   CLINICAL DATA:  Shortness of Breath  EXAM: CHEST  2 VIEW  COMPARISON:  July 28, 2014  FINDINGS: Underlying emphysematous changes again noted. Fibrotic type change in the base regions remains. There is no frank edema or consolidation. No new opacity. The heart size is normal. The pulmonary vascular reflects underlying emphysematous change. Pacemaker leads are attached to the right atrium and right ventricle. Atherosclerotic changes again noted.  IMPRESSION: No change from 1 day prior. Underlying emphysematous change with bibasilar fibrosis. No edema or consolidation.   Electronically Signed   By: Bretta Bang III M.D.   On: 07/29/2014 10:34    Scheduled Meds: . acetylcysteine  2 mL Nebulization TID  . aspirin EC  325 mg Oral Daily  . budesonide (PULMICORT) nebulizer solution  0.25 mg Nebulization BID  . cefTRIAXone (ROCEPHIN)  IV  1 g Intravenous Q24H  . famotidine  20 mg Oral BID  . heparin subcutaneous  5,000 Units Subcutaneous 3 times per day  . insulin aspart  0-10 Units Subcutaneous TID WC & HS  . ipratropium-albuterol  3 mL Nebulization Q6H  . levofloxacin (LEVAQUIN) IV  750 mg Intravenous Q48H  . methylPREDNISolone (SOLU-MEDROL) injection  60 mg Intravenous Q6H  . metoprolol tartrate  25 mg Oral QID  . sertraline  25 mg Oral Daily  . sodium chloride  3 mL Intravenous 4 times per day   Continuous Infusions: . 0.9 % NaCl with KCl 40 mEq / L 75 mL/hr (07/30/14 1400)    Assessment/Plan: Principal Problem:   Acute on chronic respiratory failure -  on O2 chronically, continue O2 here.  felt to be recurrent COPD exacerbation.  Continue steroids, antibiotics.  Add duonebs PRN for wheezing.  Pulmonology consult recommendations appreciated WBC trending down  Active Problems:   COPD exacerbation - treatment as above, continue home meds for the same  Atrial fibrillation with rapid ventricular response - spontaneously converted to NSR with controlled rate this morning.  Defer anticoagulation at this time per cardiology recommendation.  Appreciate their help.   Hyperglycemia - controlled, continue SSI and FSBS   Hypokalemia - normalized this morning, continue to monitor   Elevated troponin - likely due to afib and RVR, demand ischemia.  Do not anticoagulate at this time per Cardiology as above.  Monitor, CE trend stable.  Improving, lives inALF-may need Rehab placement or more help on d/c. Called PT.    Code Status:     Code Status Orders        Start     Ordered   07/28/14 1617  Do not attempt resuscitation (DNR)   Continuous    Question Answer Comment  In the event of cardiac or respiratory ARREST Do not call a "code blue"   In the event of cardiac or respiratory ARREST Do not perform Intubation, CPR, defibrillation or ACLS   In the event of cardiac or respiratory ARREST Use medication by any route, position, wound care, and other measures to relive pain and suffering. May use oxygen, suction and manual treatment of airway obstruction as needed for comfort.   Comments Nurse May Pronouce      07/28/14 1643     Family Communication: Daughter present in room with patient  Consultants:  Cardiology  Pulmonology   Time spent: 45 min  Altamese DillingVACHHANI, Kanyla Omeara  [facility] 07/30/2014, 9:28 PM

## 2014-07-30 NOTE — Clinical Social Work Note (Deleted)
Clinical Social Work Assessment  Patient Details  Name: Christine Bowen MRN: [id number] Date of Birth: 05/11/1922  Date of referral:  07/30/14               Reason for consult:  Facility Placement (From a facility [facility]                Permission sought to share information with:  Other (pt's son Isaias Cowmanllan [phone]) Permission granted to share information::  Yes, Verbal Permission Granted  Name::     Vito Bergerllen, Land Lord  Agency::  [facility]  Relationship::  Son   Contact Information:     Housing/Transportation Living arrangements for the past 2 months:  Single Family Home Source of Information:  Patient, Other (Comment Required) Megan Salon(Land Lord) Patient Interpreter Needed:  None Criminal Activity/Legal Involvement Pertinent to Current Situation/Hospitalization:  No - Comment as needed Significant Relationships:  Adult Children Lives with:  Self Do you feel safe going back to the place where you live?    Need for family participation in patient care:     Care giving concerns:  Pt lives alone and currently has difficulty with ADL   Social Worker assessment / plan:  CSW will send out FL2 to facilities in the area for SNF placement. Employment status:  Retired Health and safety inspectornsurance information:  Medicare PT Recommendations:  Skilled Nursing Facility Information / Referral to community resources:     Patient/Family's Response to care:  pt was pleasant and in agreement with DC to facility if it was the recommendation. Patient/Family's Understanding of and Emotional Response to Diagnosis, Current Treatment, and Prognosis:  Will Call in the morning  Emotional Assessment Appearance:  Appears stated age Attitude/Demeanor/Rapport:    Affect (typically observed):    Orientation:  Oriented to Self Alcohol / Substance use:    Psych involvement (Current and /or in the community):  No (Comment)  Discharge Needs  Concerns to be addressed:  No discharge needs  identified Readmission within the last 30 days:    Current discharge risk:    Barriers to Discharge:  Other (Not currently medically stable.)   Chauncy PassyBennerson, Ellean Firman J, LCSW 07/30/2014, 4:27 PM

## 2014-07-30 NOTE — Telephone Encounter (Signed)
Christine HymenBonnie called to cancel pt appt due to being hospitalized over the weekend.

## 2014-07-31 ENCOUNTER — Inpatient Hospital Stay: Payer: Commercial Managed Care - HMO

## 2014-07-31 LAB — GLUCOSE, CAPILLARY
Glucose-Capillary: 158 mg/dL — ABNORMAL HIGH (ref 70–99)
Glucose-Capillary: 164 mg/dL — ABNORMAL HIGH (ref 70–99)
Glucose-Capillary: 142 mg/dL — ABNORMAL HIGH (ref 70–99)
Glucose-Capillary: 150 mg/dL — ABNORMAL HIGH (ref 70–99)

## 2014-07-31 MED ORDER — HYDRALAZINE HCL 10 MG PO TABS
10.0000 mg | ORAL_TABLET | Freq: Once | ORAL | Status: AC
  Administered 2014-07-31: 10 mg via ORAL
  Filled 2014-07-31: qty 1

## 2014-07-31 MED ORDER — AMLODIPINE BESYLATE 5 MG PO TABS
5.0000 mg | ORAL_TABLET | Freq: Every day | ORAL | Status: DC
  Administered 2014-07-31 – 2014-08-01 (×2): 5 mg via ORAL
  Filled 2014-07-31 (×2): qty 1

## 2014-07-31 MED ORDER — HYDRALAZINE HCL 20 MG/ML IJ SOLN
10.0000 mg | Freq: Four times a day (QID) | INTRAMUSCULAR | Status: DC | PRN
  Administered 2014-07-31 – 2014-08-01 (×2): 10 mg via INTRAVENOUS
  Filled 2014-07-31 (×2): qty 1

## 2014-07-31 MED ORDER — LISINOPRIL 20 MG PO TABS
20.0000 mg | ORAL_TABLET | Freq: Every day | ORAL | Status: DC
  Administered 2014-07-31 – 2014-08-01 (×2): 20 mg via ORAL
  Filled 2014-07-31 (×2): qty 1

## 2014-07-31 NOTE — Progress Notes (Signed)
Patient  C/o generalised  pain at bedtime and received Tylenol 650 mg tables oral PRN. On re- assessment, patient verbalized relief. Patient was medicated two times for c/o nausea with Zofran 4 mg PRN with relief. BP this morning is 188/91. DR. Betti Cruzeddy notified and received new order for Hydralazine 10 mg oral x one dose. Will continue to monitor.

## 2014-07-31 NOTE — Progress Notes (Signed)
ST Note Chart reviewed. Remains afebrile. Pt visited. Laying in bed resting. Voice stronger but still weak. Daughter present and reported pt had no difficulty chewing or swallowing at breakfast and no coughing noted during intake. Still has cough throughout the day. Pt reported she enjoyed her meal and is feeling much better. SLP to f/u 1-3 days, possibly at meal if pt is still here. Continue with mechanical soft/dys 3 diet with thin liquids.

## 2014-07-31 NOTE — Progress Notes (Signed)
Date: 07/31/2014,   MRN# [id number] Erroll Lunavelyn M Rathe 10/18/1922 Code Status:     Code Status Orders        Start     Ordered   07/28/14 1617  Do not attempt resuscitation (DNR)   Continuous    Question Answer Comment  In the event of cardiac or respiratory ARREST Do not call a "code blue"   In the event of cardiac or respiratory ARREST Do not perform Intubation, CPR, defibrillation or ACLS   In the event of cardiac or respiratory ARREST Use medication by any route, position, wound care, and other measures to relive pain and suffering. May use oxygen, suction and manual treatment of airway obstruction as needed for comfort.   Comments Nurse May Pronouce      07/28/14 1643        CC: follow up visit  HPI: This is an [age] year ld lady, well known to me. Appreciate Dr. Garald BraverMongul help. Today still has a deep cough, not productive, rare wheezing, anxiety, two bouts with increase sob with normal sats. Daughter is in the room. Today's chest xray noted.  PMHX:   Past Medical History  Diagnosis Date  . COPD (chronic obstructive pulmonary disease)   . Hypertension   . Hypercholesterolemia   . Diverticulosis   . Hyperglycemia    Surgical Hx:  Past Surgical History  Procedure Laterality Date  . Tonsillectomy  1861  . Cholecystectomy  1978  . Wrist surgery  1988    removal of a growth   Family Hx:  Family History  Problem Relation Age of Onset  . CVA Father     cerebral hemorrhage  . Heart disease Mother     myocardial infarction  . Heart disease Brother     myocardial infarction  . Cancer Brother     unknown type  . Breast cancer Neg Hx   . Colon cancer Neg Hx    Social Hx:   History  Substance Use Topics  . Smoking status: Former Games developermoker  . Smokeless tobacco: Former NeurosurgeonUser    Quit date: 03/31/1983  . Alcohol Use: No   Medication:    Home Medication:  No current outpatient prescriptions on file.  Current Medication: @CURMEDTAB @   Allergies:  Lipitor; Prednisone;  Premarin; Protonix; and Zocor  Review of Systems: Gen:  Denies  fever, sweats, chills HEENT: Denies blurred vision, double vision, ear pain, eye pain, hearing loss, nose bleeds, sore throat Cvc:  No dizziness, chest pain or heaviness Resp:   As per above, no hemoptysis, pleurisy, calf pain or progressive leg edema Gi: Denies swallowing difficulty, stomach pain, nausea or vomiting, diarrhea, constipation, bowel incontinence Gu:  Denies bladder incontinence, burning urine Ext:   No Joint pain, stiffness or swelling Skin: No skin rash, easy bruising or bleeding or hives Endoc:  No polyuria, polydipsia , polyphagia or weight change Psych: No depression, insomnia or hallucinations  Other:  All other systems negative  Physical Examination:   VS: BP 151/80 mmHg  Pulse 65  Temp(Src) 97.5 F (36.4 C) (Oral)  Resp 18  Wt 124 lb 6.4 oz (56.427 kg)  SpO2 98%  General Appearance: No distress  Neuro: without focal findings, globally weak, mental status, speech normal, alert and oriented, cranial nerves 2-12 intact, reflexes normal and symmetric, sensation grossly normal  HEENT: PERRLA, EOM intact, no ptosis, no other lesions noticed, Mallampati: Pulmonary: rare wheezing, mild rhonchi. No rales  Sputum Production:   Cardiovascular:  Normal S1,S2.  No  m/r/g.  Abdominal aorta pulsation normal.    Abdomen:Benign, Soft, non-tender, No masses, hepatosplenomegaly, No lymphadenopathy Endoc: No evident thyromegaly, no signs of acromegaly or Cushing features Skin:   warm, no rashes, no ecchymosis  Extremities: normal, no cyanosis, clubbing, no edema, warm with normal capillary refill. Other findings:   Labs results:   Recent Labs     07/29/14  0432  HGB  13.9  HCT  42.0  MCV  86.8  WBC  32.2*  BUN  27*  CREATININE  0.91  GLUCOSE  165*  CALCIUM  8.9  ,    No results for input(s): PH in the last 72 hours.  Invalid input(s): PCO2, PO2, BASEEXCESS, BASEDEFICITE, TFT  Culture results:      Rad results:   No results found.    EKG:     Other:   Assessment and Plan: She has copd, inability to effectively cough up phlegm, ? Left basal pneumonia, on levaquin and rocephin. Better since being here. There is also an anxiety component, deconditioned. DNR Recommend: -continue present copd and antibiotic coverage -you may be able to d/c the rocephin in the am -flutter valve -vibratory vest would be ideal -reasess in am -spoke with family    I have personally obtained a history, examined the patient, evaluated laboratory and imaging results, formulated the assessment and plan and placed orders.  The Patient requires high complexity decision making for assessment and support, frequent evaluation and titration of therapies, application of advanced monitoring technologies and extensive interpretation of multiple databases.   Ziyan Hillmer,M.D. Pulmonary & Critical care Medicine [facility]

## 2014-07-31 NOTE — Progress Notes (Signed)
Patient ID: Christine Bowen, female   DOB: 05/14/1922, 81 y.o.   MRN: [id number] [facility]  SUBJECTIVE:  No chest pain   Filed Vitals:   07/31/14 0608 07/31/14 0642 07/31/14 0807 07/31/14 0851  BP:  191/100  178/87  Pulse:    68  Temp:    97.5 F (36.4 C)  TempSrc:    Oral  Resp:    22  Weight: 56.427 kg (124 lb 6.4 oz)     SpO2:   94% 99%     Intake/Output Summary (Last 24 hours) at 07/31/14 1129 Last data filed at 07/31/14 [id number]  Gross per 24 hour  Intake   1470 ml  Output   1050 ml  Net    420 ml      PHYSICAL EXAM  General:  Elderly female in mild distress HEENT:  Normocephalic and atramatic Neck:  No JVD.  Lungs: Clear bilaterally to auscultation and percussion. Heart:  Irregular regular rhythm. Normal S1 and S2 without gallops or murmurs.  Abdomen: Bowel sounds are positive, abdomen soft and non-tender  Msk:  Back normal, normal gait. Normal strength and tone for age. Extremities: No clubbing, cyanosis or edema.   Neuro: Alert and oriented X 3. Psych:  Good affect, responds appropriately   LABS: Basic Metabolic Panel:  Recent Labs  11/91/4703/04/15 0432  NA 139  K 3.8  CL 90*  CO2 38*  GLUCOSE 165*  BUN 27*  CREATININE 0.91  CALCIUM 8.9  MG 1.8   Liver Function Tests: No results for input(s): AST, ALT, ALKPHOS, BILITOT, PROT, ALBUMIN in the last 72 hours. No results for input(s): LIPASE, AMYLASE in the last 72 hours. CBC:  Recent Labs  07/29/14 0432  WBC 32.2*  HGB 13.9  HCT 42.0  MCV 86.8  PLT 341   Cardiac Enzymes: No results for input(s): CKTOTAL, CKMB, CKMBINDEX, TROPONINI in the last 72 hours. BNP: Invalid input(s): POCBNP D-Dimer: No results for input(s): DDIMER in the last 72 hours. Hemoglobin A1C: No results for input(s): HGBA1C in the last 72 hours. Fasting Lipid Panel: No results for input(s): CHOL, HDL, LDLCALC, TRIG, CHOLHDL, LDLDIRECT in the last 72 hours. Thyroid Function Tests: No results for input(s): TSH, T4TOTAL,  T3FREE, THYROIDAB in the last 72 hours.  Invalid input(s): FREET3 Anemia Panel: No results for input(s): VITAMINB12, FOLATE, FERRITIN, TIBC, IRON, RETICCTPCT in the last 72 hours.  No results found.   Echo  ECHO not done this hospitalization  TELEMETRY:  Atrial fibrillation with a controlled ventricular rate  ASSESSMENT AND PLAN:  Principal Problem:   Acute on chronic respiratory failure Active Problems:   Hyperglycemia   COPD exacerbation   Atrial fibrillation with rapid ventricular response   Hypokalemia   Elevated troponin    1.  Chronic atrial fibrillation, with initial rapid rate, which was compensatory for respiratory failure, now stabilized, with controlled rate /  Defer chronic anticoagulation 2. Respiratory failure, multifactorial, secondary to COPD exacerbation, pneumonia and diastolic congestive heart failure, resolving /  Continue antibiotic therapy, and diuresis   Dax Murguia, MD, PhD, [facility] 07/31/2014 11:29 AM

## 2014-07-31 NOTE — Progress Notes (Signed)
Patient's BP= 191/100 after  taking Hydralazine 10 mg. DR. Betti Cruzeddy notified, no new order but to give it a little bit time and continue to monitor.

## 2014-07-31 NOTE — Progress Notes (Signed)
[facility] Physicians PROGRESS NOTE  Christine Bowen QIO:[id number] DOB: 1922-11-03 DOA: 07/28/2014 PCP: Dale Forsyth, MD  HPI/Subjective: Pt states she is feeling about the same today.  Daughter is in the room with her and says she feels the patient is breathing a little easier.  Pt complains of hoarseness, and daughter states that she has had this hoarseness for several weeks.  today she had a panic episode in morning, but did not had hypoxia.  ROS: Constitutional: No fevers/chills, fatigue, weakness Eyes: No blurred or double vision, pain, redness ENT: Hoarseness, no ear pain, hearing loss, difficulty swallowing Resp: Wheeze, cough, improving dyspnea, no hemoptysis Cardio-vascular: No chest pain, orthopnea, edema, palpitations, syncope GI: No nausea/vomiting/diarrhea, abdominal pain, constipation GU: No dysuria, hematuria, frequency Endocrine: No nocturia, thyroid problems, heat or cold intolerance Hematologic/Lymphatic: No anemia, easy bruising bleeding, swollen glands Integumentary: No acne, rash, lesions Musculoskeletal: No acute arthritis, joint swelling, gout Neuro: No numbness, weakness, headache Psych: Had some anxiety, insomnia, depression  Objective:  Intake/Output Summary (Last 24 hours) at 07/31/14 2305 Last data filed at 07/31/14 1738  Gross per 24 hour  Intake    725 ml  Output   2300 ml  Net  -1575 ml    Exam:   Constitutional: Filed Vitals:   07/31/14 1423 07/31/14 1633 07/31/14 1937 07/31/14 2028  BP: 161/77 151/80  161/73  Pulse:  65  75  Temp:  98.2 F (36.8 C)  98.1 F (36.7 C)  TempSrc:  Axillary  Oral  Resp:  18  20  Weight:      SpO2:  98% 99% 99%   Wt Readings from Last 3 Encounters:  07/31/14 124 lb 6.4 oz (56.427 kg)  06/28/14 123 lb 12.8 oz (56.155 kg)  05/31/14 127 lb 4 oz (57.72 kg)   General:  Well dressed, well nourished, in no apparent distress HEENT: PERRL, EOMI, no scleral icterus, no conjunctivitis, no difficulty  hearing Neck: supple, no masses, non tender, no cervical adenopathy, no JVD, thyroid not enlarged Cardiovascular: RRR, no m/r/g, no S3/S4, good pedal pulses, no LE edema. Respiratory: diffuse bilateral wheeze, no rales, no ronchi, breath sounds not diminished, no increased respiratory effort Abdomen: soft, nontender, nondistended, no mass, bowel sounds present Musculoskeletal: 4/5 muscular strength x4 extremities, full spontaneous range of motion throughout, no cyanosis/clubbing Skin: no rash, no lesions, bilateral LE ecchymosis, warm and dry  Lymphatic: No adenopathy Neurologic: Cranial nerves intact, sensation intact, no dysarthria, no aphasia Psychiatric: Alert, Oriented to time, person, place, circumstance, cooperative, not confused, not agitated, not depressed    Data Reviewed: Basic Metabolic Panel:  Recent Labs Lab 07/28/14 1002 07/29/14 0432  NA 136 139  K 2.8* 3.8  CL 91* 90*  CO2 34* 38*  GLUCOSE 179* 165*  BUN 22* 27*  CREATININE 0.76 0.91  CALCIUM 9.1 8.9  MG  --  1.8   Liver Function Tests:  Recent Labs Lab 07/28/14 1002  AST 23  ALT 16  ALKPHOS 84  PROT 6.8  ALBUMIN 3.3*   No results for input(s): LIPASE, AMYLASE in the last 168 hours. No results for input(s): AMMONIA in the last 168 hours. CBC:  Recent Labs Lab 07/28/14 1002 07/29/14 0432  WBC 42.0* 32.2*  HGB 14.4 13.9  HCT 44.6 42.0  MCV 87 86.8  PLT 340 341   Cardiac Enzymes: No results for input(s): CKTOTAL, CKMB, CKMBINDEX, TROPONINI in the last 168 hours. BNP (last 3 results) No results for input(s): BNP in the last 8760 hours.  ProBNP (last 3 results) No results for input(s): PROBNP in the last 8760 hours.  CBG:  Recent Labs Lab 07/30/14 1937 07/31/14 0735 07/31/14 1150 07/31/14 1719 07/31/14 2031  GLUCAP 190* 158* 164* 142* 150*    Recent Results (from the past 240 hour(s))  Culture, blood (single)     Status: None (Preliminary result)   Collection Time: 07/28/14  11:31 AM  Result Value Ref Range Status   Micro Text Report   Preliminary       COMMENT                   NO GROWTH IN 48 HOURS   ANTIBIOTIC                                                      Culture, blood (single)     Status: None (Preliminary result)   Collection Time: 07/28/14 11:31 AM  Result Value Ref Range Status   Micro Text Report   Preliminary       COMMENT                   NO GROWTH IN 48 HOURS   ANTIBIOTIC                                                         Studies: Dg Chest 2 View  07/31/2014   CLINICAL DATA:  Hypoxia  EXAM: CHEST  2 VIEW  COMPARISON:  07/29/2014  FINDINGS: Chronic interstitial markings/emphysematous changes. Scarring/ fibrosis in the right mid lung, lingula, and bilateral lower lobes.  Superimposed left lower lobe pneumonia is suspected, especially when correlating with the lateral view. No pleural effusion or pneumothorax.  The heart is normal in size.  Left subclavian pacemaker.  Degenerative changes of the visualized thoracolumbar spine.  IMPRESSION: Chronic interstitial markings/emphysematous changes with lower lobe predominant scarring/fibrosis.  Superimposed left lower lobe pneumonia is suspected.   Electronically Signed   By: Charline BillsSriyesh  Krishnan M.D.   On: 07/31/2014 15:28    Scheduled Meds: . amLODipine  5 mg Oral Daily  . aspirin EC  325 mg Oral Daily  . cefTRIAXone (ROCEPHIN)  IV  1 g Intravenous Q24H  . famotidine  20 mg Oral BID  . heparin subcutaneous  5,000 Units Subcutaneous 3 times per day  . insulin aspart  0-10 Units Subcutaneous TID WC & HS  . ipratropium-albuterol  3 mL Nebulization Q6H  . levofloxacin (LEVAQUIN) IV  750 mg Intravenous Q48H  . lisinopril  20 mg Oral Daily  . methylPREDNISolone (SOLU-MEDROL) injection  60 mg Intravenous Q6H  . metoprolol tartrate  25 mg Oral QID  . sertraline  25 mg Oral Daily  . sodium chloride  3 mL Intravenous 4 times per day   Continuous Infusions:    Assessment/Plan:  *  Acute on  chronic respiratory failure - on O2 chronically, continue O2 here.  felt to be recurrent COPD exacerbation.  Continue steroids, antibiotics.  Add duonebs PRN for wheezing.  Pulmonology consult recommendations appreciated WBC trending down  *  COPD exacerbation - treatment as above, continue home meds for the same   Atrial fibrillation  with rapid ventricular response - spontaneously converted to NSR with controlled rate this morning.  Defer anticoagulation at this time per cardiology recommendation.  Appreciate their help.  *  Hyperglycemia - controlled, continue SSI and FSBS *  Hypokalemia - normalized this morning, continue to monitor *  Elevated troponin - likely due to afib and RVR, demand ischemia.  Do not anticoagulate at this time per Cardiology as above.  Monitor, CE trend stable.  Improving, lives inALF-may need Rehab placement or more help on d/c. Called PT.   Likely d/c in 1-2 days.  Code Status:     Code Status Orders        Start     Ordered   07/28/14 1617  Do not attempt resuscitation (DNR)   Continuous    Question Answer Comment  In the event of cardiac or respiratory ARREST Do not call a "code blue"   In the event of cardiac or respiratory ARREST Do not perform Intubation, CPR, defibrillation or ACLS   In the event of cardiac or respiratory ARREST Use medication by any route, position, wound care, and other measures to relive pain and suffering. May use oxygen, suction and manual treatment of airway obstruction as needed for comfort.   Comments Nurse May Pronouce      07/28/14 1643     Family Communication: Daughter present in room with patient  Consultants:  Cardiology  Pulmonology   Time spent: 45 min  Altamese Dilling  [facility] 07/31/2014, 11:05 PM

## 2014-07-31 NOTE — Evaluation (Signed)
Physical Therapy Evaluation Patient Details Name: Christine Bowen MRN: [id number] DOB: [birth year] Today's Date: 07/31/2014   History of Present Illness  81 yo female with onset of weakness and SOB with chronic hospitalizations lately had admission for CAP, tachycadia, hypoxia, with elevated troponin, demand ischemia, hyperglycemia, elevated potassium.  PMHx:  COPD, chronic respiratory failure, recent bronchitis, a-fib, HTN, SSS  Clinical Impression  Pt was seen for assessment of her condition which is mainly SOB related but saturations are stable.  Her audible congestion is an issue, with clearing of some with sitting and standing.  Her limited tolerance for activity makes her a SNF candidate and will expect her to do this, as discussed with daughter and pt.  Will try to increase her gait distance next visit.    Follow Up Recommendations SNF    Equipment Recommendations  None recommended by PT    Recommendations for Other Services       Precautions / Restrictions Precautions Precautions: Fall Restrictions Weight Bearing Restrictions: No      Mobility  Bed Mobility Overal bed mobility: Needs Assistance Bed Mobility: Supine to Sit;Sit to Supine     Supine to sit: Mod assist Sit to supine: Mod assist   General bed mobility comments: using bedrail and unable to lift trunk off bedside from sidelying  Transfers Overall transfer level: Needs assistance Equipment used: Rolling walker (2 wheeled);1 person hand held assist Transfers: Sit to/from Stand Sit to Stand: Min assist         General transfer comment: cues for hand placment  Ambulation/Gait Ambulation/Gait assistance: Min assist Ambulation Distance (Feet): 5 Feet Assistive device: Rolling walker (2 wheeled);1 person hand held assist Gait Pattern/deviations: Step-to pattern;Decreased stride length;Wide base of support;Trunk flexed Gait velocity: slow Gait velocity interpretation: Below normal speed for  age/gender General Gait Details: weak and SOB but controlled with min assist  Stairs            Wheelchair Mobility    Modified Rankin (Stroke Patients Only)       Balance Overall balance assessment: Needs assistance Sitting-balance support: Feet supported Sitting balance-Leahy Scale: Fair   Postural control: Other (comment) (forward lean) Standing balance support: Bilateral upper extremity supported Standing balance-Leahy Scale: Poor                               Pertinent Vitals/Pain Pain Assessment: Faces Faces Pain Scale: Hurts whole lot Pain Location: lower legs Pain Descriptors / Indicators: Jabbing Pain Intervention(s): Limited activity within patient's tolerance;Monitored during session;Repositioned;Other (comment) (pain to touch skin, better without touch)    Home Living Family/patient expects to be discharged to:: Assisted living               Home Equipment: Walker - 4 wheels;Shower seat;Grab bars - toilet;Toilet riser      Prior Function Level of Independence: Needs assistance   Gait / Transfers Assistance Needed: supervision  ADL's / Homemaking Assistance Needed: ALF cares for home and cooking        Hand Dominance        Extremity/Trunk Assessment   Upper Extremity Assessment: Overall WFL for tasks assessed           Lower Extremity Assessment: Generalized weakness      Cervical / Trunk Assessment: Normal  Communication   Communication: No difficulties  Cognition Arousal/Alertness: Awake/alert Behavior During Therapy: WFL for tasks assessed/performed Overall Cognitive Status: Within Functional Limits for tasks assessed  General Comments General comments (skin integrity, edema, etc.): Pt is so SOB that her ability to exert is low but with [facility]     Exercises        Assessment/Plan    PT Assessment Patient needs continued PT services  PT Diagnosis Difficulty  walking;Generalized weakness   PT Problem List Decreased strength;Decreased range of motion;Decreased activity tolerance;Decreased balance;Decreased mobility;Decreased coordination;Decreased knowledge of use of DME;Decreased skin integrity;Cardiopulmonary status limiting activity  PT Treatment Interventions Gait training;DME instruction;Functional mobility training;Therapeutic activities;Therapeutic exercise;Balance training;Neuromuscular re-education;Patient/family education   PT Goals (Current goals can be found in the Care Plan section) Acute Rehab PT Goals Patient Stated Goal: to get to ALF  PT Goal Formulation: With patient/family Time For Goal Achievement: 08/14/14 Potential to Achieve Goals: Good    Frequency Min 2X/week   Barriers to discharge Other (comment) (Weaker than ALF level) inability to walk alone with severe SOB    Co-evaluation               End of Session Equipment Utilized During Treatment: Gait belt;Oxygen Activity Tolerance: Patient limited by fatigue Patient left: in bed;with call bell/phone within reach;with bed alarm set;with family/visitor present Nurse Communication: Mobility status         Time: [id number] PT Time Calculation (min) (ACUTE ONLY): 30 min   Charges:   PT Evaluation $Initial PT Evaluation Tier I: 1 Procedure PT Treatments $Therapeutic Activity: 8-22 mins   PT G Codes:        Ivar DrapeStout, Garlan Drewes E 07/31/2014, 1:34 PM   Samul Dadauth Trajon Rosete, PT MS Acute Rehab Dept. Number: [phone]

## 2014-07-31 NOTE — Progress Notes (Signed)
Notified Dr. Elisabeth PigeonVachhani of patient's hypertension. MD added lisinopril and amlodipine to daily meds, ordered to give them one hour after scheduled due to IV hydralazine and metoprolol recently given. Patient having difficulty breathing as well, respiratory therapy assessed patient, and patient was given ativan to calm anxiety. Will continue to monitor.

## 2014-08-01 LAB — BASIC METABOLIC PANEL WITH GFR
Anion gap: 7 (ref 5–15)
BUN: 27 mg/dL — ABNORMAL HIGH (ref 6–20)
CO2: 34 mmol/L — ABNORMAL HIGH (ref 22–32)
Calcium: 8.6 mg/dL — ABNORMAL LOW (ref 8.9–10.3)
Chloride: 94 mmol/L — ABNORMAL LOW (ref 101–111)
Creatinine, Ser: 0.8 mg/dL (ref 0.44–1.00)
GFR calc Af Amer: >60 mL/min
GFR calc non Af Amer: >60 mL/min
Glucose, Bld: 154 mg/dL — ABNORMAL HIGH (ref 65–99)
Potassium: 4.3 mmol/L (ref 3.5–5.1)
Sodium: 135 mmol/L (ref 135–145)

## 2014-08-01 LAB — GLUCOSE, CAPILLARY
GLUCOSE-CAPILLARY: 142 mg/dL — AB (ref 70–99)
Glucose-Capillary: 155 mg/dL — ABNORMAL HIGH (ref 70–99)
Glucose-Capillary: 157 mg/dL — ABNORMAL HIGH (ref 70–99)

## 2014-08-01 LAB — CBC
HCT: 38.5 % (ref 35.0–47.0)
HEMOGLOBIN: 12.6 g/dL (ref 12.0–16.0)
MCH: 28.6 pg (ref 26.0–34.0)
MCHC: 32.8 g/dL (ref 32.0–36.0)
MCV: 87.3 fL (ref 80.0–100.0)
PLATELETS: 308 10*3/uL (ref 150–440)
RBC: 4.41 MIL/uL (ref 3.80–5.20)
RDW: 14.4 % (ref 11.5–14.5)
WBC: 17.8 10*3/uL — AB (ref 3.6–11.0)

## 2014-08-01 MED ORDER — FUROSEMIDE 10 MG/ML IJ SOLN: 20.0000 mg | INTRAMUSCULAR | Status: AC

## 2014-08-01 MED ORDER — METHYLPREDNISOLONE SODIUM SUCC 125 MG IJ SOLR: 60.0000 mg | Freq: Every day | INTRAMUSCULAR | Status: DC

## 2014-08-01 MED ORDER — CEFUROXIME AXETIL 500 MG PO TABS
500.0000 mg | ORAL_TABLET | Freq: Two times a day (BID) | ORAL | Status: DC
  Filled 2014-08-01: qty 1

## 2014-08-01 MED ORDER — LEVOFLOXACIN 750 MG PO TABS
750.0000 mg | ORAL_TABLET | ORAL | Status: DC
  Administered 2014-08-01: 750 mg via ORAL
  Filled 2014-08-01: qty 1

## 2014-08-01 MED ORDER — LORAZEPAM 2 MG/ML IJ SOLN: 0.5000 mg | INTRAMUSCULAR | Status: AC | PRN

## 2014-08-01 MED ORDER — MORPHINE SULFATE 20 MG/5ML PO SOLN: 2.5000 mg | ORAL | Status: AC | PRN

## 2014-08-01 MED ORDER — PREDNISONE 50 MG PO TABS: 60.0000 mg | ORAL_TABLET | Freq: Every day | ORAL | Status: DC

## 2014-08-01 MED ORDER — FUROSEMIDE 10 MG/ML IJ SOLN
20.0000 mg | INTRAMUSCULAR | Status: DC
  Administered 2014-08-01: 20 mg via INTRAVENOUS
  Filled 2014-08-01: qty 2

## 2014-08-01 NOTE — Discharge Planning (Signed)
Hospice Home Discharge Orders   Patient:  Christine Bowen is an 81 y.o., female MRN:  [id number] DOB:  12/31/1922 Patient phone:  [phone] (home)  Patient address:   [address] [place] [id number],     Admit: Admit to Hospice Home  Code Status: DNR  Diet: as tolerated  Activity: as tolerated  Oxygen: use for comfort  Foley: Leave foley cath for urinary incontinence/previous skin breakdown    Date of Admission:  07/28/2014   Principal Problem: Acute on chronic respiratory failure Discharge Diagnoses: Patient Active Problem List   Diagnosis Date Noted  . COPD exacerbation [J44.1] 07/29/2014  . Atrial fibrillation with rapid ventricular response [I48.91] 07/29/2014  . Hypokalemia [E87.6] 07/29/2014  . Elevated troponin [R79.89] 07/29/2014  . Acute on chronic respiratory failure [J96.20] 07/28/2014  . Atrial fibrillation [I48.91] 06/03/2014  . UTI (urinary tract infection) [N39.0] 04/01/2014  . B12 deficiency [E53.8] 11/30/2013  . Fatigue [R53.83] 06/18/2013  . Nausea with vomiting [R11.2] 02/25/2013  . Tachy-brady syndrome [I49.5] 02/25/2013  . Hypertension [I10] 02/18/2012  . Hypercholesterolemia [E78.0] 02/18/2012  . COPD (chronic obstructive pulmonary disease) [J44.9] 02/18/2012  . Hyperglycemia [R73.9] 02/18/2012     Past Medical History:  Past Medical History  Diagnosis Date  . COPD (chronic obstructive pulmonary disease)   . Hypertension   . Hypercholesterolemia   . Diverticulosis   . Hyperglycemia     Past Surgical History  Procedure Laterality Date  . Tonsillectomy  1861  . Cholecystectomy  1978  . Wrist surgery  1988    removal of a growth   Family History:  Family History  Problem Relation Age of Onset  . CVA Father     cerebral hemorrhage  . Heart disease Mother     myocardial infarction  . Heart disease Brother     myocardial infarction  . Cancer Brother     unknown type  . Breast cancer Neg Hx   . Colon cancer Neg Hx     Social History:  History  Alcohol Use No     History  Drug Use No    History   Social History  . Marital Status: Widowed    Spouse Name: N/A  . Number of Children: 1  . Years of Education: N/A   Social History Main Topics  . Smoking status: Former Games developermoker  . Smokeless tobacco: Former NeurosurgeonUser    Quit date: 03/31/1983  . Alcohol Use: No  . Drug Use: No  . Sexual Activity: Not on file   Other Topics Concern  . Not on file   Social History Narrative     Discharge Vitals:   Blood pressure 138/62, pulse 69, temperature 97.8 F (36.6 C), temperature source Oral, resp. rate 22, weight 53.978 kg (119 lb), SpO2 100 %. Body mass index is 22.5 kg/(m^2). Lab Results:         Medications: May crush or use liquid when appropriate. May change to rectal route if unable to swallow.    Medication List    STOP taking these medications        acetaminophen 325 MG tablet  Commonly known as:  TYLENOL     ADVAIR DISKUS 250-50 MCG/DOSE Aepb  Generic drug:  Fluticasone-Salmeterol     ALIGN 4 MG Caps     cephALEXin 500 MG capsule  Commonly known as:  KEFLEX     guaifenesin 100 MG/5ML syrup  Commonly known as:  ROBITUSSIN     loperamide 2 MG capsule  Commonly known as:  IMODIUM     magnesium hydroxide 400 MG/5ML suspension  Commonly known as:  MILK OF MAGNESIA     meclizine 25 MG tablet  Commonly known as:  ANTIVERT     metoprolol succinate 25 MG 24 hr tablet  Commonly known as:  TOPROL-XL     ranitidine 150 MG tablet  Commonly known as:  ZANTAC     sertraline 25 MG tablet  Commonly known as:  ZOLOFT     tiotropium 18 MCG inhalation capsule  Commonly known as:  SPIRIVA      TAKE these medications        albuterol (2.5 MG/3ML) 0.083% nebulizer solution  Commonly known as:  PROVENTIL  Take 2.5 mg by nebulization every 4 (four) hours as needed for wheezing or shortness of breath.     furosemide 10 MG/ML injection  Commonly known as:  LASIX  Inject 2 mLs  (20 mg total) into the vein every other day.     LORazepam 2 MG/ML injection  Commonly known as:  ATIVAN  Inject 0.25 mLs (0.5 mg total) into the vein every 4 (four) hours as needed for anxiety or seizure.     morphine 20 MG/5ML solution  Take 0.6 mLs (2.4 mg total) by mouth every 2 (two) hours as needed for pain (dyspnea).      ASK your doctor about these medications        ondansetron 4 MG tablet  Commonly known as:  ZOFRAN  One tablet q 8-12 hours prn nausea           Comments:    Signed: Steele Bergancy W Reda Gettis 08/01/2014, 4:35 PM

## 2014-08-01 NOTE — Clinical Social Work Note (Signed)
CSW has sent out FL2, however CS[facility] and [facility].  RNCM will review hospice options with pt.  Current options are return to [facility] with hospice following, or Hospice Home.  CSW will contact ALF to determine what hospice agency they work with.  CSW will continue to follow.

## 2014-08-01 NOTE — Discharge Summary (Signed)
Patient is discharge to hospice home in a stable condition no acute distress noted , denies pain at time of discharge, transported by EMS , accompany by daughter

## 2014-08-01 NOTE — Progress Notes (Signed)
[facility] referral received following a Palliative Care Consult. Christine Bowen is an 81 y.o. female with end-stage COPD, a.fib, SSS s/p PPM, HTN, s/p cholecystectomy & appendectomy. She was admitted 07/28/14 with acute on chronic respiratory failure. This is pt's 3rd hospitalization in 2 months. She has continued to decline despite medical interventions. Patient seen lying in bed, minimally responsive, oxygen at 2 Liters continuous, audible secretions noted.  Writer met with patient's daughter Christine Bowen who is her 52, and Christine Bowen's cousin Christine Bowen. Hospice services at [facility] were explained. Christine Bowen was very complimentary of the care her mother has been receiving at [facility]. She did express concerns about the level of care her mother now requires and did request information about services at the Hospice Home. After much discussion Christine Bowen stated "I just need to follow my heart", she chose for patient to transfer to the [facility] for end of life care. Information faxed to referral intake, consents signed and faxed, report called, MD, CM, CSW and RN all aware of plan for patient to discharge to the Hospice Home today.  Portable DNR in place in discharge packet, EMS notified. Thank you for the opportunity to serve Christine Bowen and her family. Flo Shanks RN, BSN, [place] and [facility] of [place], [facility] [phone])

## 2014-08-01 NOTE — Care Management Note (Signed)
It is being discussed that patient is to be discharged to skilled nursing.  Informed that may be possibility that patient could discharge back to [facility] with resumption of home health through [facility].  Patient is minimally responsive.  It is reported that this is due to  An Ativan injection.  Patient's daughter says that patient is significantly below her base line.  Discuss palliative care consult which was performed.  Spoke with [facility] and informed by Elnoria Howard that if patient returned to [facility] as total care- her care needs could be met.  Palliative care made referral for hospice screen.  Agency preference is [facility].  Contacted karen- hospice liasion.

## 2014-08-01 NOTE — Consult Note (Signed)
Palliative Medicine Inpatient Consult Note   Name: Christine Bowen Date: 08/01/2014 MRN: [id number]  DOB: 09-24-1922  Referring Physician: Fritzi Mandes, MD  Palliative Care consult requested for this 81 y.o. female for goals of medical therapy in patient with end-stage COPD, a.fib, SSS s/p PPM, HTN, s/p cholecystectomy & appendectomy. She was admitted 07/28/14 with acute on chronic respiratory failure. This is pt's 3rd hospitalization in 2 months. At present, pt is lying in bed. Does not appear dyspneic but minimallly responsive. Daughter and niece at bedside.    REVIEW OF SYSTEMS:  Patient is not able to provide ROS  SOCIAL HISTORY:  reports that she has quit smoking. She quit smokeless tobacco use about 31 years ago. She reports that she does not drink alcohol or use illicit drugs. Pt is widowed. She has one daughter who is her HCPOA. She is a resident of [facility].   LEGAL DOCUMENTS:  Health Care Power of Attorney:  Yes.Daughter  CODE STATUS: DNR  PAST MEDICAL HISTORY: Past Medical History  Diagnosis Date  . COPD (chronic obstructive pulmonary disease)   . Hypertension   . Hypercholesterolemia   . Diverticulosis   . Hyperglycemia     PAST SURGICAL HISTORY:  Past Surgical History  Procedure Laterality Date  . Tonsillectomy  1861  . Cholecystectomy  1978  . Wrist surgery  1988    removal of a growth    ALLERGIES:  is allergic to lipitor; prednisone; premarin; protonix; and zocor.  MEDICATIONS:  Current Facility-Administered Medications  Medication Dose Route Frequency Provider Last Rate Last Dose  . acetaminophen (TYLENOL) tablet 650 mg  650 mg Oral Q4H PRN Idelle Crouch, MD   650 mg at 07/30/14 2104  . amLODipine (NORVASC) tablet 5 mg  5 mg Oral Daily Vaughan Basta, MD   5 mg at 08/01/14 1035  . aspirin EC tablet 325 mg  325 mg Oral Daily Idelle Crouch, MD   325 mg at 08/01/14 1035  . [START ON 08/02/2014] cefUROXime (CEFTIN) tablet 500 mg  500 mg  Oral BID WC Fritzi Mandes, MD      . famotidine (PEPCID) tablet 20 mg  20 mg Oral BID Idelle Crouch, MD   20 mg at 08/01/14 1034  . furosemide (LASIX) injection 20 mg  20 mg Intravenous QODAY Fritzi Mandes, MD   20 mg at 08/01/14 1222  . heparin injection 5,000 Units  5,000 Units Subcutaneous 3 times per day Idelle Crouch, MD   5,000 Units at 08/01/14 1316  . hydrALAZINE (APRESOLINE) injection 10 mg  10 mg Intravenous Q6H PRN Vaughan Basta, MD   10 mg at 08/01/14 0023  . insulin aspart (novoLOG) injection 0-10 Units  0-10 Units Subcutaneous TID WC & HS Idelle Crouch, MD   0 Units at 07/29/14 [phone]  . ipratropium-albuterol (DUONEB) 0.5-2.5 (3) MG/3ML nebulizer solution 3 mL  3 mL Nebulization Q4H PRN Lance Coon, MD   3 mL at 07/29/14 1542  . ipratropium-albuterol (DUONEB) 0.5-2.5 (3) MG/3ML nebulizer solution 3 mL  3 mL Nebulization Q6H Vishal Mungal, MD   3 mL at 08/01/14 1451  . levofloxacin (LEVAQUIN) tablet 750 mg  750 mg Oral Q48H Fritzi Mandes, MD      . lisinopril (PRINIVIL,ZESTRIL) tablet 20 mg  20 mg Oral Daily Vaughan Basta, MD   20 mg at 08/01/14 1035  . LORazepam (ATIVAN) injection 0.5 mg  0.5 mg Intravenous Q4H PRN Idelle Crouch, MD   0.5 mg  at 07/31/14 1416  . metoprolol tartrate (LOPRESSOR) tablet 25 mg  25 mg Oral QID Idelle Crouch, MD   25 mg at 08/01/14 1151  . morphine 2 MG/ML injection 1 mg  1 mg Intravenous Q6H PRN Idelle Crouch, MD      . morphine 2 MG/ML injection 2-4 mg  2-4 mg Intravenous Q2H PRN Idelle Crouch, MD      . ondansetron [facility]) injection 4 mg  4 mg Intravenous Q4H PRN Idelle Crouch, MD   4 mg at 07/31/14 0329  . [START ON 08/02/2014] predniSONE (DELTASONE) tablet 60 mg  60 mg Oral Q breakfast Fritzi Mandes, MD      . sertraline (ZOLOFT) tablet 25 mg  25 mg Oral Daily Idelle Crouch, MD   25 mg at 08/01/14 1035  . sodium chloride 0.9 % injection 3 mL  3 mL Intravenous 4 times per day Doctor Chlconversion, MD   3 mL at 08/01/14 1154   . sodium chloride 0.9 % injection 3 mL  3 mL Intravenous PRN Doctor Chlconversion, MD   3 mL at 08/01/14 0023    Vital Signs: BP 154/78 mmHg  Pulse 75  Temp(Src) 97.6 F (36.4 C) (Oral)  Resp 18  Wt 53.978 kg (119 lb)  SpO2 100% Filed Weights   07/30/14 0436 07/31/14 0608 08/01/14 0531  Weight: 52.164 kg (115 lb) 56.427 kg (124 lb 6.4 oz) 53.978 kg (119 lb)    Estimated body mass index is 22.5 kg/(m^2) as calculated from the following:   Height as of 06/28/14: 5' 1"  (1.549 m).   Weight as of this encounter: 53.978 kg (119 lb).  PERFORMANCE STATUS (ECOG) : 4 - Bedbound  PHYSICAL EXAM: General appearance: critically ill appearing Head: Normocephalic, without obvious abnormality, atraumatic, OP clear Neck: supple, symmetrical, trachea midline Resp: poor air movement, no audible wheeze Cardio: regular rate and rhythm, S1, S2 normal, no murmur, click, rub or gallop GI: soft, NT, decreased BS's Extremities: no edema Neurologic: Mental status: no response to verbal or tactile stimuli LABS: CBC:  Recent Labs Lab 07/29/14 0432 08/01/14 0425  WBC 32.2* 17.8*  HGB 13.9 12.6  HCT 42.0 38.5  PLT 341 308   Comprehensive Metabolic Panel:  Recent Labs Lab 07/28/14 1002 07/29/14 0432 08/01/14 0425  NA 136 139 135  K 2.8* 3.8 4.3  CL 91* 90* 94*  CO2 34* 38* 34*  GLUCOSE 179* 165* 154*  BUN 22* 27* 27*  CREATININE 0.76 0.91 0.80  CALCIUM 9.1 8.9 8.6*  MG  --  1.8  --   AST 23  --   --   ALT 16  --   --   ALKPHOS 84  --   --     IMPRESSION: Ms Edmundson is an 81 y.o. female with end-stage COPD, a.fib, SSS s/p PPM, HTN, s/p cholecystectomy & appendectomy. She was admitted 07/28/14 with acute on chronic respiratory failure. This is pt's 3rd hospitalization in 2 months. At present, pt is lying in bed. Does not appear dyspneic but minimallly responsive.   I met with pt's daughter and niece. They say that pt has had a dramatic decline over the past 1-2 months. After her last  hospitalization, she was no longer getting OOB. Daughter recognizes that pt may be approaching the end of life. We discussed the involvement of hospice and daughter agrees with this. Daughter would like for pt to return to [facility] if possible but worries that ALF may not be  able to provide adequate care for pt. We also discussed transfer to the Hospice Home. Hospice liason will speak with family. CM will discuss with staff at [facility].   PLAN: 1. Hospice screen 2. D/c to ALF with hospice following vs [facility]:  Hospice Social work Care Management   More than 50% of the visit was spent in counseling/coordination of care: Yes  Time spent: 70 minutes

## 2014-08-01 NOTE — Progress Notes (Signed)
PT Cancellation Note  Patient Details Name: Christine Bowen MRN: [id number] DOB: 09/26/1922   Cancelled Treatment:    Reason Eval/Treat Not Completed: Patient's level of consciousness;Medical issues which prohibited therapy   Ivar DrapeStout, Nitya Cauthon E 08/01/2014, 4:56 PM   Samul Dadauth Richad Ramsay, PT MS Acute Rehab Dept. Number: [phone]

## 2014-08-01 NOTE — Progress Notes (Signed)
[facility] PROGRESS NOTE  Christine Bowen [id number] DOB: 04/16/1922 DOA: 07/28/2014 PCP: Dale DurhamSCOTT, CHARLENE, MD  HPI/Subjective: Pt states she is feeling about the same today.  Daughter is in the room with her and says she feels the patient is breathing a little easier. -now on 2.5 Liter N -cough+ =eating little  ROS: Constitutional: No fevers/chills, +fatigue, +weakness Eyes: No blurred or double vision, pain, redness ENT: Hoarseness, no ear pain, hearing loss, difficulty swallowing Resp: Wheeze, cough, improving dyspnea, no hemoptysis Cardio-vascular: No chest pain, orthopnea, edema, palpitations, syncope GI: No nausea/vomiting/diarrhea, abdominal pain, constipation GU: No dysuria, hematuria, frequency Endocrine: No nocturia, thyroid problems, heat or cold intolerance Hematologic/Lymphatic: No anemia, easy bruising bleeding, swollen glands Integumentary: No acne, rash, lesions Musculoskeletal: No acute arthritis, joint swelling, gout Neuro: No numbness, ++weakness, headache Psych: Had some anxiety, insomnia, depression  Objective:  Intake/Output Summary (Last 24 hours) at 08/01/14 1132 Last data filed at 08/01/14 1055  Gross per 24 hour  Intake    725 ml  Output   1000 ml  Net   -275 ml    Exam:   Constitutional: Filed Vitals:   08/01/14 0212 08/01/14 0531 08/01/14 0749 08/01/14 1032  BP:  141/75  154/78  Pulse:  76  75  Temp:  98.2 F (36.8 C)  97.6 F (36.4 C)  TempSrc:  Oral  Oral  Resp:  18    Weight:  53.978 kg (119 lb)    SpO2: 98% 94% 94% 100%   Wt Readings from Last 3 Encounters:  08/01/14 53.978 kg (119 lb)  06/28/14 56.155 kg (123 lb 12.8 oz)  05/31/14 57.72 kg (127 lb 4 oz)   General:  Well dressed, well nourished, in no apparent distress HEENT: PERRL, EOMI, no scleral icterus, no conjunctivitis, no difficulty hearing Neck: supple, no masses, non tender, no cervical adenopathy, no JVD, thyroid not enlarged Cardiovascular: RRR, no  m/r/g, no S3/S4, good pedal pulses, no LE edema. Respiratory: diffuse bilateral wheeze, no rales, no ronchi, breath sounds not diminished, no increased respiratory effort Abdomen: soft, nontender, nondistended, no mass, bowel sounds present Musculoskeletal: 4/5 muscular strength x4 extremities, full spontaneous range of motion throughout, no cyanosis/clubbing Skin: no rash, no lesions, bilateral LE ecchymosis, warm and dry  Lymphatic: No adenopathy Neurologic: Cranial nerves intact, sensation intact, no dysarthria, no aphasia Psychiatric: Alert, Oriented to time, person, place, circumstance, cooperative, not confused, not agitated, not depressed    Data Reviewed: Basic Metabolic Panel:  Recent Labs Lab 07/28/14 1002 07/29/14 0432 08/01/14 0425  NA 136 139 135  K 2.8* 3.8 4.3  CL 91* 90* 94*  CO2 34* 38* 34*  GLUCOSE 179* 165* 154*  BUN 22* 27* 27*  CREATININE 0.76 0.91 0.80  CALCIUM 9.1 8.9 8.6*  MG  --  1.8  --    Liver Function Tests:  Recent Labs Lab 07/28/14 1002  AST 23  ALT 16  ALKPHOS 84  PROT 6.8  ALBUMIN 3.3*   No results for input(s): LIPASE, AMYLASE in the last 168 hours. No results for input(s): AMMONIA in the last 168 hours. CBC:  Recent Labs Lab 07/28/14 1002 07/29/14 0432 08/01/14 0425  WBC 42.0* 32.2* 17.8*  HGB 14.4 13.9 12.6  HCT 44.6 42.0 38.5  MCV 87 86.8 87.3  PLT 340 341 308   Cardiac Enzymes: No results for input(s): CKTOTAL, CKMB, CKMBINDEX, TROPONINI in the last 168 hours. BNP (last 3 results) No results for input(s): BNP in the last 8760 hours.  ProBNP (last 3 results) No results for input(s): PROBNP in the last 8760 hours.  CBG:  Recent Labs Lab 07/31/14 1150 07/31/14 1719 07/31/14 2031 08/01/14 0728 08/01/14 1105  GLUCAP 164* 142* 150* 142* 155*    Recent Results (from the past 240 hour(s))  Culture, blood (single)     Status: None (Preliminary result)   Collection Time: 07/28/14 11:31 AM  Result Value Ref Range  Status   Micro Text Report   Preliminary       COMMENT                   NO GROWTH IN 48 HOURS   ANTIBIOTIC                                                      Culture, blood (single)     Status: None (Preliminary result)   Collection Time: 07/28/14 11:31 AM  Result Value Ref Range Status   Micro Text Report   Preliminary       COMMENT                   NO GROWTH IN 48 HOURS   ANTIBIOTIC                                                         Studies: Dg Chest 2 View  07/31/2014   CLINICAL DATA:  Hypoxia  EXAM: CHEST  2 VIEW  COMPARISON:  07/29/2014  FINDINGS: Chronic interstitial markings/emphysematous changes. Scarring/ fibrosis in the right mid lung, lingula, and bilateral lower lobes.  Superimposed left lower lobe pneumonia is suspected, especially when correlating with the lateral view. No pleural effusion or pneumothorax.  The heart is normal in size.  Left subclavian pacemaker.  Degenerative changes of the visualized thoracolumbar spine.  IMPRESSION: Chronic interstitial markings/emphysematous changes with lower lobe predominant scarring/fibrosis.  Superimposed left lower lobe pneumonia is suspected.   Electronically Signed   By: Charline Bills M.D.   On: 07/31/2014 15:28    Scheduled Meds: . amLODipine  5 mg Oral Daily  . aspirin EC  325 mg Oral Daily  . [START ON 08/02/2014] cefUROXime  500 mg Oral BID WC  . famotidine  20 mg Oral BID  . furosemide  20 mg Intravenous QODAY  . heparin subcutaneous  5,000 Units Subcutaneous 3 times per day  . insulin aspart  0-10 Units Subcutaneous TID WC & HS  . ipratropium-albuterol  3 mL Nebulization Q6H  . levofloxacin  750 mg Oral Q48H  . lisinopril  20 mg Oral Daily  . [START ON 08/02/2014] methylPREDNISolone (SOLU-MEDROL) injection  60 mg Intravenous Daily  . metoprolol tartrate  25 mg Oral QID  . [START ON 08/02/2014] predniSONE  60 mg Oral Q breakfast  . sertraline  25 mg Oral Daily  . sodium chloride  3 mL Intravenous 4 times per  day   Continuous Infusions:    Assessment/Plan:  *  Acute on chronic respiratory failure - on O2 chronically, continue O2 here.  felt to be recurrent COPD exacerbation.  Continue steroids, antibiotics.  Add duonebs PRN for wheezing.  Pulmonology consult recommendations appreciated  WBC trending down -WBC 42K--->17K -afebrile -BC neg-change to po levaquin and ceftin  *  COPD exacerbation - treatment as above, continue home meds for the same  * Atrial fibrillation with rapid ventricular response - spontaneously converted to NSR with controlled rate.  Defer anticoagulation at this time per cardiology recommendation.  Appreciate their help. Only on ASA  *  Hyperglycemia - controlled, continue SSI and FSBS  *  Hypokalemia - normalized this morning, continue to monitor  *  Elevated troponin - likely due to afib and RVR, demand ischemia.  Do not anticoagulate at this time per Cardiology as above.  -CE trend stable.  * D/c planing to Rehab per PT recommendations -overall slow Improvment, lives in ALF - d/c one rehab bed available  Spoke with dter and infromed of plan  Code Status:     Code Status Orders        Start     Ordered   07/28/14 1617  Do not attempt resuscitation (DNR)   Continuous    Question Answer Comment  In the event of cardiac or respiratory ARREST Do not call a "code blue"   In the event of cardiac or respiratory ARREST Do not perform Intubation, CPR, defibrillation or ACLS   In the event of cardiac or respiratory ARREST Use medication by any route, position, wound care, and other measures to relive pain and suffering. May use oxygen, suction and manual treatment of airway obstruction as needed for comfort.   Comments Nurse May Pronouce      07/28/14 1643     Family Communication: Daughter present in room with patient  Consultants:  Cardiology Dr Cassie Freerparachos  Pulmonology Dr Meredeth IdeFleming   Time spent: 45 min  Kolby Schara  [facility] 08/01/2014,  11:32 AM

## 2014-08-01 NOTE — Progress Notes (Signed)
Date: 08/01/2014,   MRN# [id number] Erroll Lunavelyn M Odden 04/13/1922 Code Status:     Code Status Orders        Start     Ordered   07/28/14 1617  Do not attempt resuscitation (DNR)   Continuous    Question Answer Comment  In the event of cardiac or respiratory ARREST Do not call a "code blue"   In the event of cardiac or respiratory ARREST Do not perform Intubation, CPR, defibrillation or ACLS   In the event of cardiac or respiratory ARREST Use medication by any route, position, wound care, and other measures to relive pain and suffering. May use oxygen, suction and manual treatment of airway obstruction as needed for comfort.   Comments Nurse May Pronouce      07/28/14 1643        HPI: Today somewhat somnulent,   To be transferred to hospice. Spoke with the family, appreciate palliative help.     PMHX:   Past Medical History  Diagnosis Date  . COPD (chronic obstructive pulmonary disease)   . Hypertension   . Hypercholesterolemia   . Diverticulosis   . Hyperglycemia    Surgical Hx:  Past Surgical History  Procedure Laterality Date  . Tonsillectomy  1861  . Cholecystectomy  1978  . Wrist surgery  1988    removal of a growth   Family Hx:  Family History  Problem Relation Age of Onset  . CVA Father     cerebral hemorrhage  . Heart disease Mother     myocardial infarction  . Heart disease Brother     myocardial infarction  . Cancer Brother     unknown type  . Breast cancer Neg Hx   . Colon cancer Neg Hx    Social Hx:   History  Substance Use Topics  . Smoking status: Former Games developermoker  . Smokeless tobacco: Former NeurosurgeonUser    Quit date: 03/31/1983  . Alcohol Use: No   Medication:    Home Medication:  Current Outpatient Rx  Name  Route  Sig  Dispense  Refill  . furosemide (LASIX) 10 MG/ML injection   Intravenous   Inject 2 mLs (20 mg total) into the vein every other day.   4 mL   0   . LORazepam (ATIVAN) 2 MG/ML injection   Intravenous   Inject 0.25 mLs (0.5 mg  total) into the vein every 4 (four) hours as needed for anxiety or seizure.   1 mL   0   . morphine 20 MG/5ML solution   Oral   Take 0.6 mLs (2.4 mg total) by mouth every 2 (two) hours as needed for pain (dyspnea).   30 mL   0     Current Medication: @CURMEDTAB @   Allergies:  Lipitor; Prednisone; Premarin; Protonix; and Zocor  Review of Systems: per family Gen:  Denies  fever, sweats, chills HEENT: Denies blurred vision, double vision, ear pain, eye pain, hearing loss, nose bleeds, sore throat Cvc:  No dizziness, chest pain or heaviness Resp:   + ve cough, inability to cough up phlegm   Gi: Denies swallowing difficulty, stomach pain, nausea or vomiting, diarrhea, constipation, bowel incontinence Gu:  Denies bladder incontinence, burning urine Ext:   No Joint pain, stiffness or swelling Skin: No skin rash, easy bruising or bleeding or hives Endoc:  No polyuria, polydipsia , polyphagia or weight change Psych: No depression, insomnia or hallucinations  Other:  All other systems negative  Physical Examination:  VS: BP 138/62 mmHg  Pulse 69  Temp(Src) 97.8 F (36.6 C) (Oral)  Resp 22  Wt 119 lb (53.978 kg)  SpO2 100%  General Appearance: No distress  Neuro: without focal findings, mental status, speech normal, alert and oriented, cranial nerves 2-12 intact, reflexes normal and symmetric, sedated HEENT: PERRLA, EOM intact, no ptosis, no other lesions noticed, Mallampati: Pulmonary:.No wheezing, + ve rhonchi  - ve Sputum Production:   Cardiovascular:  Normal S1,S2.  No m/r/g.  Abdominal aorta pulsation normal.    Abdomen:Benign, Soft, non-tender, No masses, hepatosplenomegaly, No lymphadenopathy Endoc: No evident thyromegaly, no signs of acromegaly or Cushing features Skin:   warm, no rashes, no ecchymosis  Extremities: normal, no cyanosis, clubbing, no edema, warm with normal capillary refill. Other findings:   Labs results:   Recent Labs     08/01/14  0425  HGB   12.6  HCT  38.5  MCV  87.3  WBC  17.8*  BUN  27*  CREATININE  0.80  GLUCOSE  154*  CALCIUM  8.6*  ,    No results for input(s): PH in the last 72 hours.  Invalid input(s): PCO2, PO2, BASEEXCESS, BASEDEFICITE, TFT  Culture results:     Rad results:   Dg Chest 2 View  07/31/2014   CLINICAL DATA:  Hypoxia  EXAM: CHEST  2 VIEW  COMPARISON:  07/29/2014  FINDINGS: Chronic interstitial markings/emphysematous changes. Scarring/ fibrosis in the right mid lung, lingula, and bilateral lower lobes.  Superimposed left lower lobe pneumonia is suspected, especially when correlating with the lateral view. No pleural effusion or pneumothorax.  The heart is normal in size.  Left subclavian pacemaker.  Degenerative changes of the visualized thoracolumbar spine.  IMPRESSION: Chronic interstitial markings/emphysematous changes with lower lobe predominant scarring/fibrosis.  Superimposed left lower lobe pneumonia is suspected.   Electronically Signed   By: Charline BillsSriyesh  Krishnan M.D.   On: 07/31/2014 15:28    Assessment and Plan: She has copd, inability to effectively cough up phlegm, ? Left basal pneumonia, on levaquin and rocephin.  There is also an anxiety component, deconditioned. DNR. Prognosis is poor   Recommend: -continue present copd regimen -discharge on levaquin -flutter valve -vibratory vest would be ideal -spoke with family -agree with hospice   I have personally obtained a history, examined the patient, evaluated laboratory and imaging results, formulated the assessment and plan and placed orders.   Herbon Fleming,M.D. Pulmonary & Critical care Medicine [facility]

## 2014-08-01 NOTE — Care Management (Signed)
Patient is for transfer to Hospice home.  [facility] notified

## 2014-08-02 ENCOUNTER — Ambulatory Visit: Payer: Commercial Managed Care - HMO

## 2014-08-02 ENCOUNTER — Ambulatory Visit: Payer: Commercial Managed Care - HMO | Admitting: Internal Medicine

## 2014-08-02 ENCOUNTER — Encounter: Payer: Self-pay | Admitting: *Deleted

## 2014-08-02 NOTE — Telephone Encounter (Signed)
Received a faxed today stating that patient was admitted to Hospice on 08/01/14.

## 2014-08-02 NOTE — Telephone Encounter (Signed)
Noted.  Keep us posted.  Please contact [name] (daughter) - I received a letter from her stating that Ms Hall's medicaid was denied.  What information does she need from us?  Does she still need the information since her status has changed?

## 2014-08-02 NOTE — Telephone Encounter (Signed)
Sent mychart message

## 2014-08-06 NOTE — Discharge Summary (Signed)
[facility] NAME: Christine Bowen    MR#:  [id number]  DATE OF BIRTH:  11/25/1922  DATE OF ADMISSION:  07/28/2014 ADMITTING PHYSICIAN: Idelle Crouch, MD  DATE OF DISCHARGE: 08/01/2014  PRIMARY CARE PHYSICIAN: Einar Pheasant, MD    ADMISSION DIAGNOSIS:  CP and increasing sob  DISCHARGE DIAGNOSIS:  Acute on Chronic Respiratory failure on oxygen Afib with RVR  SECONDARY DIAGNOSIS:   Past Medical History  Diagnosis Date  . COPD (chronic obstructive pulmonary disease)   . Hypertension   . Hypercholesterolemia   . Diverticulosis   . Hyperglycemia     HOSPITAL COURSE:   * Acute on chronic respiratory failure - on O2 chronically,  - felt to be recurrent COPD exacerbation. Continue steroids, antibiotics. Add duonebs PRN for wheezing. Pulmonology consult recommendations appreciated WBC trending down -WBC 42K--->17K -afebrile -BC neg-changed to po levaquin and ceftin  * COPD exacerbation - treatment as above, continue home meds for the same -overall very slow improvement given repeated admission for same problem.  * Atrial fibrillation with rapid ventricular response - spontaneously converted to NSR with controlled rate. Defer anticoagulation at this time per cardiology recommendation. Appreciate their help. Only on ASA  * Hyperglycemia - controlled, continue SSI and FSBS  * Hypokalemia - normalized this morning, continue to monitor  * Elevated troponin - likely due to afib and RVR, demand ischemia. Do not anticoagulate at this time per Cardiology as above. -CE trend stable.  * pt was seen by Dr Ermalinda Memos and she met with her daughter. Family was agreeable for pt to go to hospice facility  DISCHARGE CONDITIONS:   guarded  CONSULTS OBTAINED:    Isaias Cowman, MD Erby Pian, MD Vilinda Boehringer, MD Dr Phifer,MD  DRUG ALLERGIES:   Allergies  Allergen Reactions  . Lipitor [Atorvastatin]    . Prednisone Nausea And Vomiting    Caused anxiety   . Premarin [Conjugated Estrogens]     Premarin cream   . Protonix [Pantoprazole Sodium] Nausea Only  . Zocor [Simvastatin]     DISCHARGE MEDICATIONS:   Discharge Medication List as of 08/01/2014  6:47 PM    START taking these medications   Details  furosemide (LASIX) 10 MG/ML injection Inject 2 mLs (20 mg total) into the vein every other day., Starting 08/01/2014, Until Discontinued, Normal    LORazepam (ATIVAN) 2 MG/ML injection Inject 0.25 mLs (0.5 mg total) into the vein every 4 (four) hours as needed for anxiety or seizure., Starting 08/01/2014, Until Discontinued, Print    morphine 20 MG/5ML solution Take 0.6 mLs (2.4 mg total) by mouth every 2 (two) hours as needed for pain (dyspnea)., Starting 08/01/2014, Until Discontinued, Print      CONTINUE these medications which have NOT CHANGED   Details  albuterol (PROVENTIL) (2.5 MG/3ML) 0.083% nebulizer solution Take 2.5 mg by nebulization every 4 (four) hours as needed for wheezing or shortness of breath., Until Discontinued, Historical Med      STOP taking these medications     acetaminophen (TYLENOL) 325 MG tablet      Fluticasone-Salmeterol (ADVAIR DISKUS) 250-50 MCG/DOSE AEPB      guaifenesin (ROBITUSSIN) 100 MG/5ML syrup      loperamide (IMODIUM) 2 MG capsule      magnesium hydroxide (MILK OF MAGNESIA) 400 MG/5ML suspension      meclizine (ANTIVERT) 25 MG tablet      metoprolol succinate (TOPROL-XL) 25 MG 24 hr tablet  ondansetron (ZOFRAN) 4 MG tablet      Probiotic Product (ALIGN) 4 MG CAPS      ranitidine (ZANTAC) 150 MG tablet      sertraline (ZOLOFT) 25 MG tablet      tiotropium (SPIRIVA) 18 MCG inhalation capsule      VENTOLIN HFA 108 (90 BASE) MCG/ACT inhaler      cephALEXin (KEFLEX) 500 MG capsule          DISCHARGE INSTRUCTIONS:    Per hospice home admit orders  I Recent Labs Lab 08/01/14 0425  WBC 17.8*  HGB 12.6  HCT 38.5  PLT  308    Chemistries   Recent Labs Lab 08/01/14 0425  NA 135  K 4.3  CL 94*  CO2 34*  GLUCOSE 154*  BUN 27*  CREATININE 0.80  CALCIUM 8.6*    Cardiac Enzymes No results for input(s): TROPONINI in the last 168 hours.  Microbiology Results  Results for orders placed or performed during the hospital encounter of 07/28/14  Culture, blood (single)     Status: None (Preliminary result)   Collection Time: 07/28/14 11:31 AM  Result Value Ref Range Status   Micro Text Report   Preliminary       COMMENT                   NO GROWTH IN 31 HOURS   ANTIBIOTIC                                                      Culture, blood (single)     Status: None (Preliminary result)   Collection Time: 07/28/14 11:31 AM  Result Value Ref Range Status   Micro Text Report   Preliminary       COMMENT                   NO GROWTH IN 48 HOURS   ANTIBIOTIC                                                        RADIOLOGY:  No results found.  EKG:   Orders placed or performed during the hospital encounter of 07/28/14  . EKG      Management plans discussed with the patient, family and they are in agreement.  CODE STATUS: DNR  TOTAL TIME TAKING CARE OF THIS PATIENT: 30 minutes.    Kada Friesen M.D on 08/06/2014 at 12:03 PM  Between 7am to 6pm - Pager - [phone] After 6pm go to [email or web address] - password [email or web address] [facility]  Office  [phone]  CC: Primary care physician; Einar Pheasant, MD

## 2014-08-29 DEATH — deceased

## 2014-09-28 ENCOUNTER — Ambulatory Visit: Payer: Commercial Managed Care - HMO | Admitting: Internal Medicine

## 2014-11-09 ENCOUNTER — Telehealth: Payer: Self-pay

## 2014-11-09 NOTE — Telephone Encounter (Signed)
Christine Bowen with [employer] needed dx code for urine culture done in March. Dx code was never received. Gave dx N39.0

## 2015-10-31 IMAGING — CR DG CHEST 1V PORT
1 series · 2 of 2 positions shown · non-contrast
Comparison: July 18, 2014

CLINICAL DATA: Shortness of breath. Atrial fibrillation. History of
COPD

EXAM:
PORTABLE CHEST - 1 VIEW

[Series 1: dxr portable chest single view · 0.14mm/px · 2 of 2 slices shown]
[im 1/2]
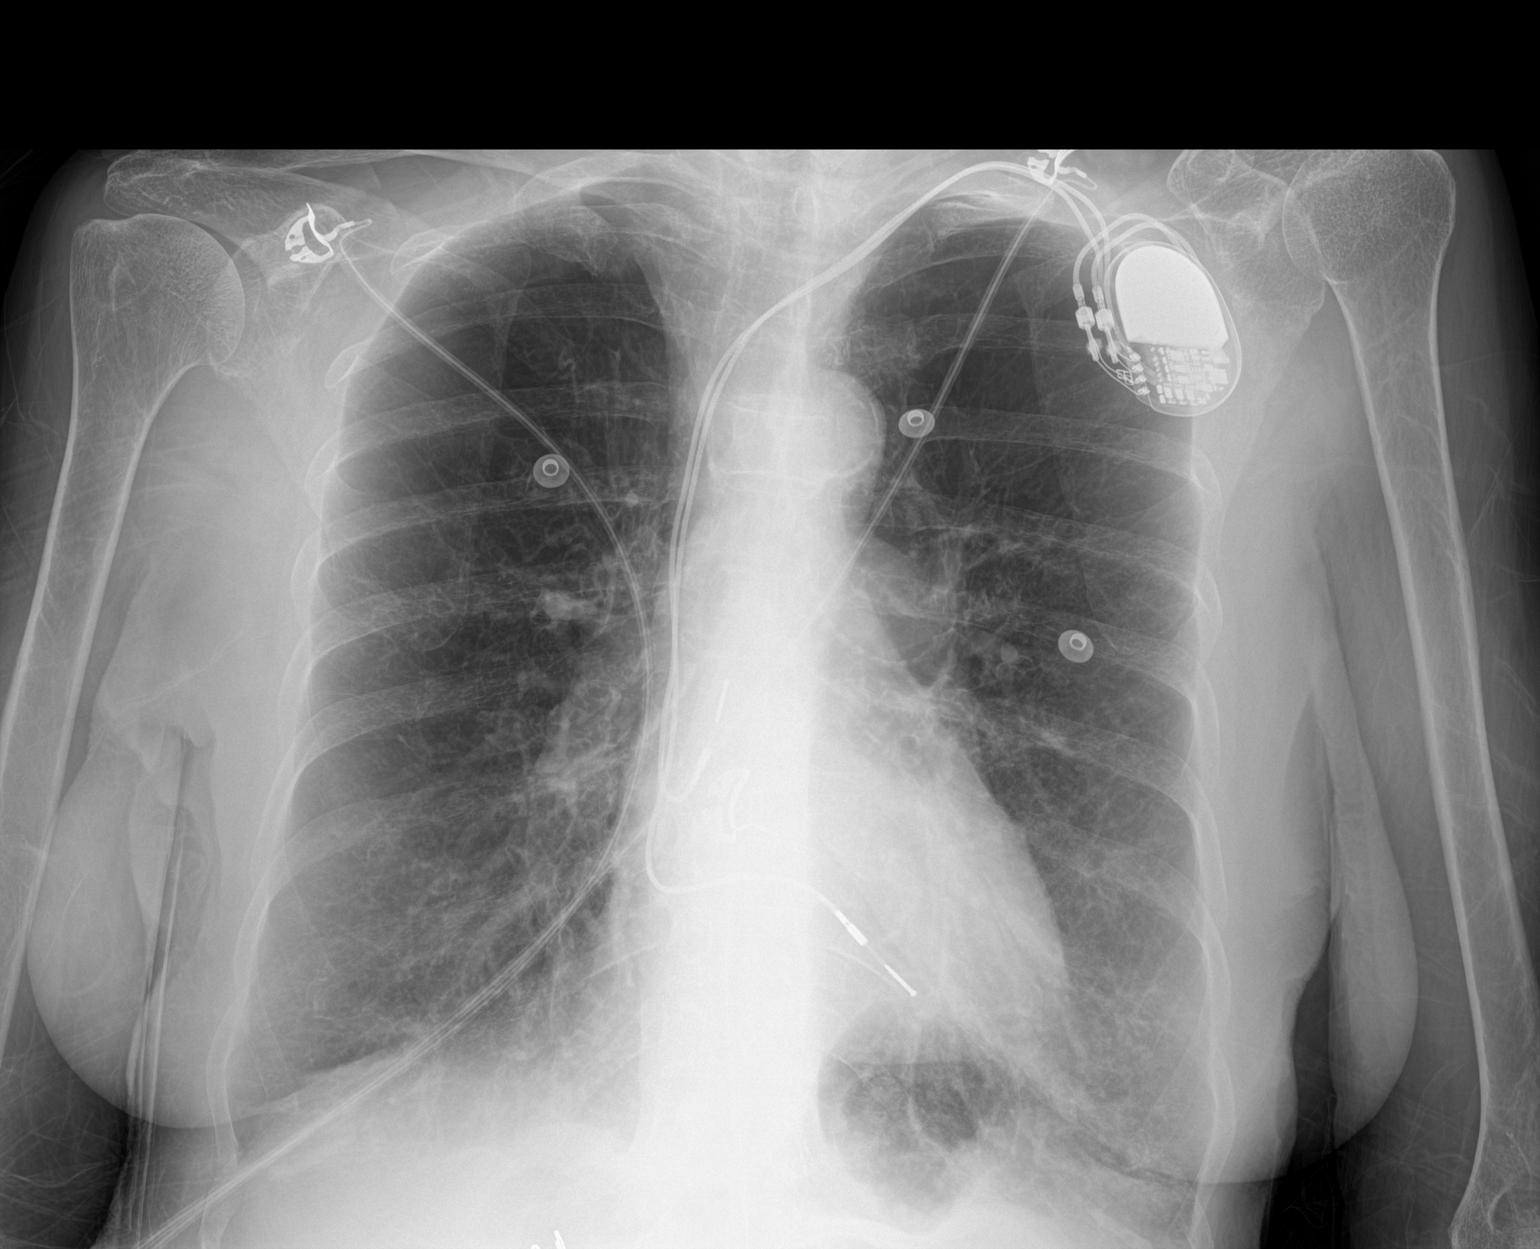
[im 2/2]
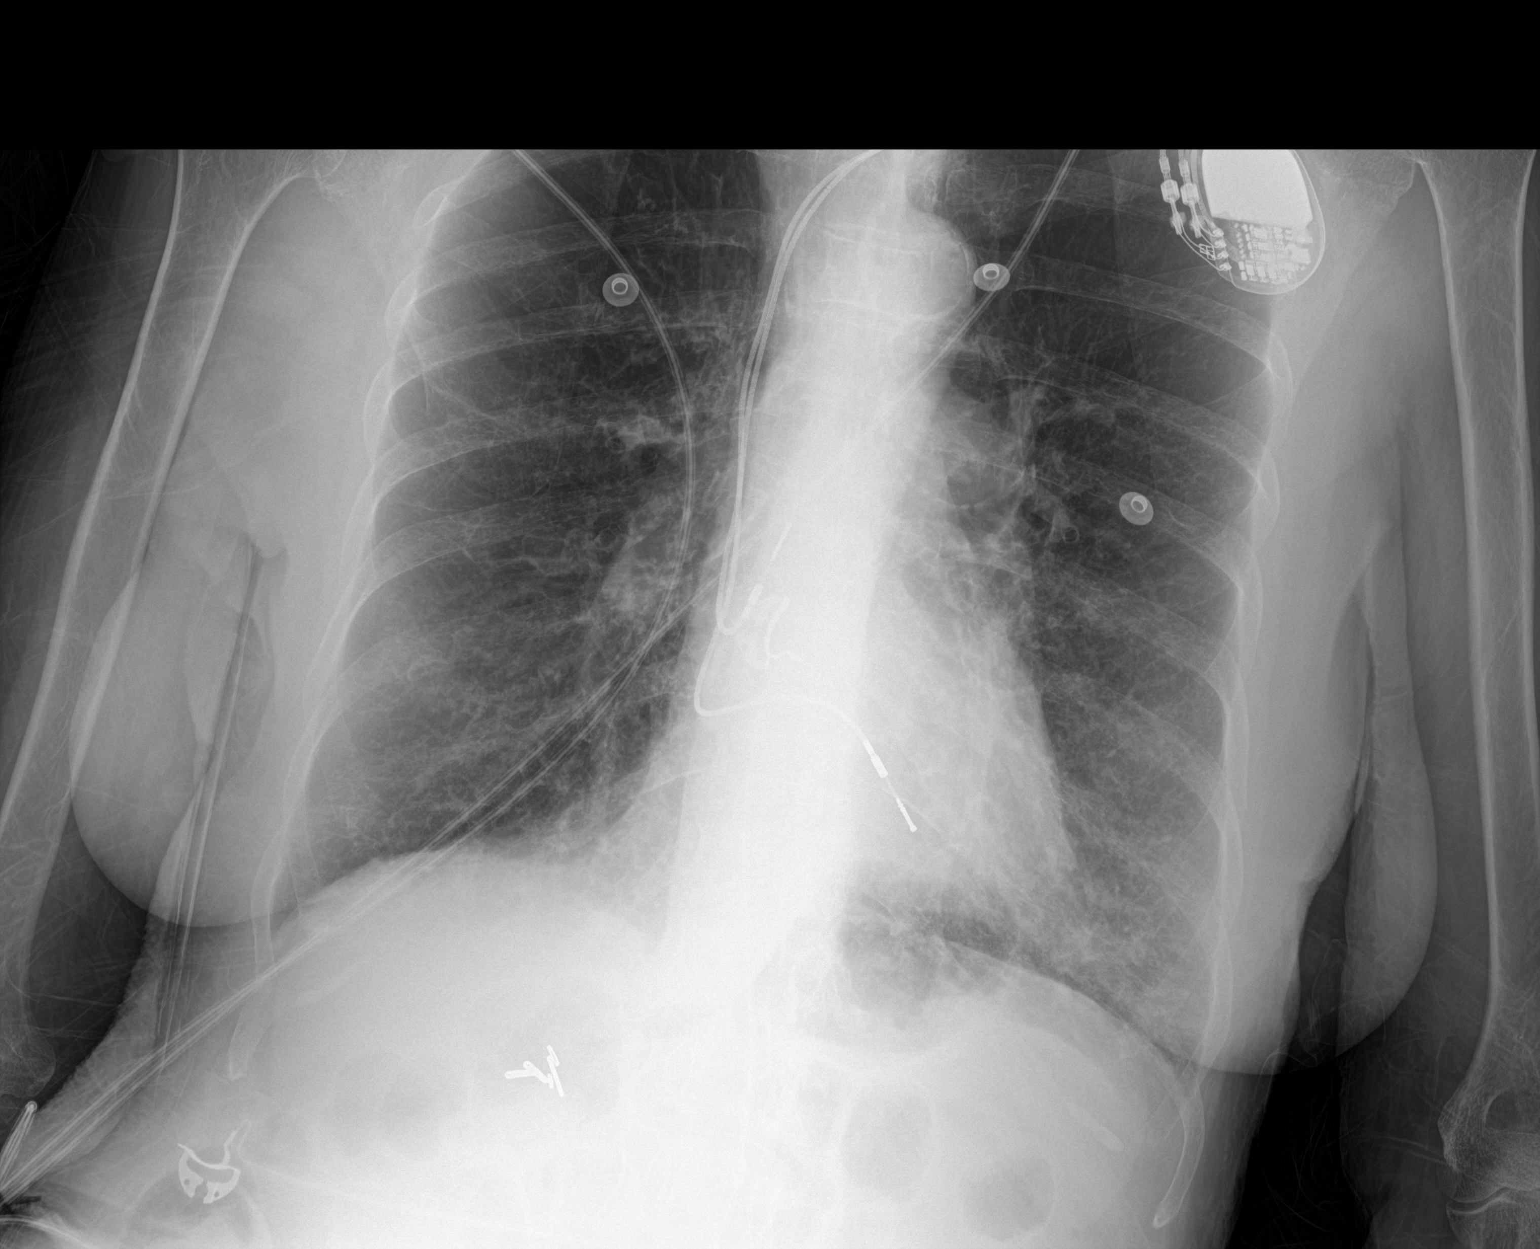

[2 of 2 positions shown; findings below may reference images not displayed]

FINDINGS: There is underlying emphysematous change. There is stable fibrotic
type change in the base regions. There is no edema or consolidation.
Heart size is normal. Pulmonary vascularity reflects underlying
emphysema. No adenopathy. Pacemaker leads are attached to the right
atrium and right ventricle. There is atherosclerotic change in the
aortic arch region.
IMPRESSION: Underlying emphysematous change. Bibasilar fibrotic type change. No
edema or consolidation. No change in cardiac silhouette.

## 2015-10-31 IMAGING — CT CT HEAD WITHOUT CONTRAST
1 of 3 series · 12 of 30 positions shown, 15 images · non-contrast
Comparison: Head CT June 11, 2014

CLINICAL DATA: Pain and confusion following fall

EXAM:
CT HEAD WITHOUT CONTRAST
CT CERVICAL SPINE WITHOUT CONTRAST
TECHNIQUE: Multidetector CT imaging of the head and cervical spine was
performed following the standard protocol without intravenous
contrast. Multiplanar CT image reconstructions of the cervical spine
were also generated.

[Series 10: orthogonal axials · axial · 0.25mm/px · z∈[+936,+1049]mm · 12 of 82 slices shown, 15 images]
[im 7/82  brain]
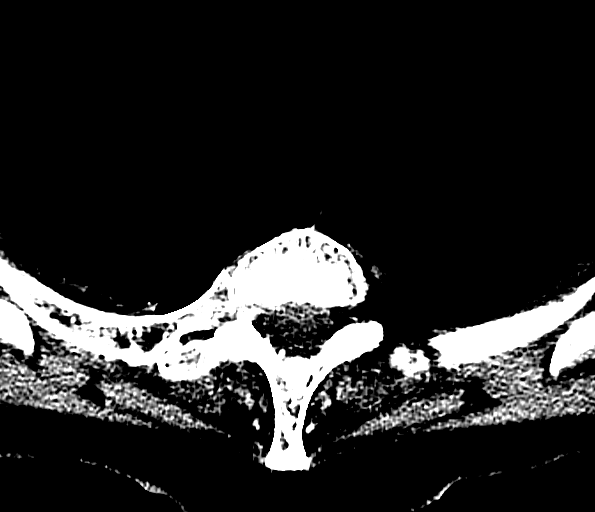
[im 7/82  bone]
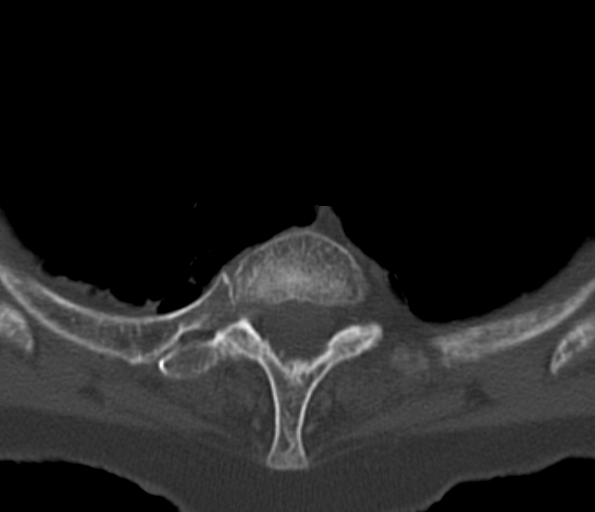
[im 13/82  brain]
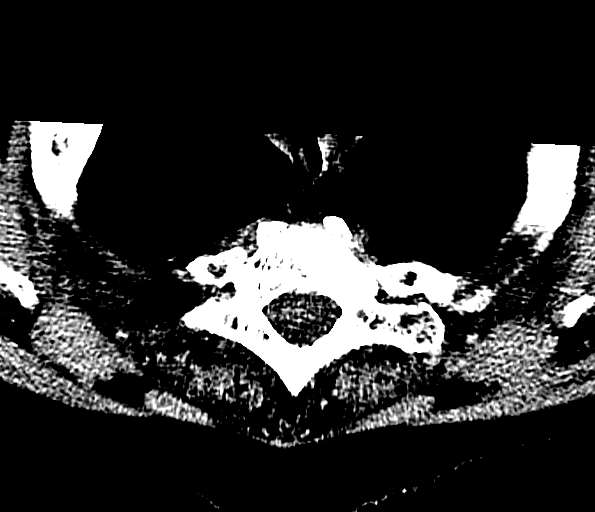
[im 19/82  brain]
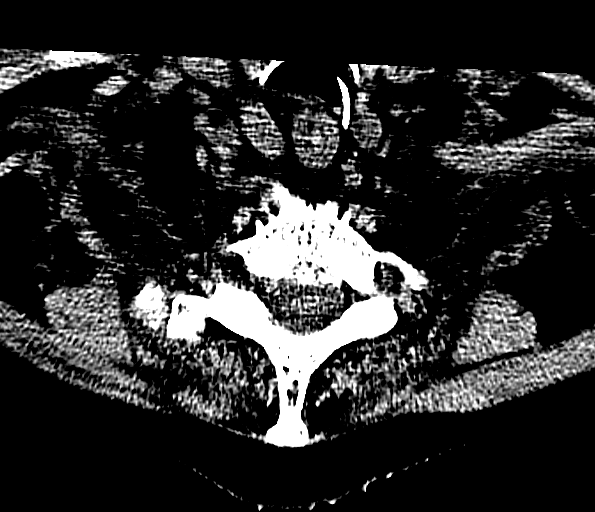
[im 25/82  brain]
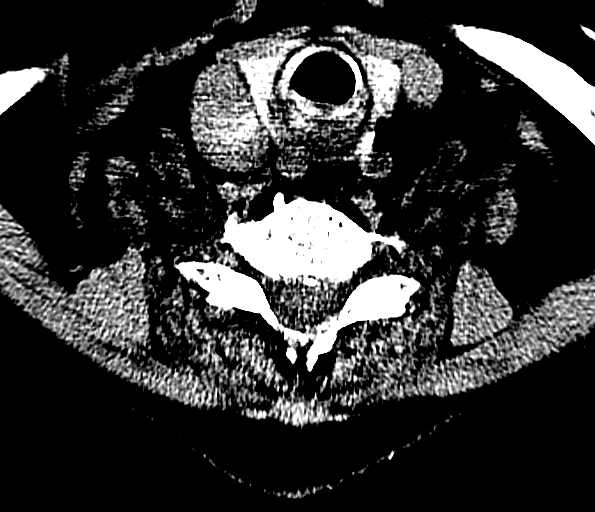
[im 32/82  brain]
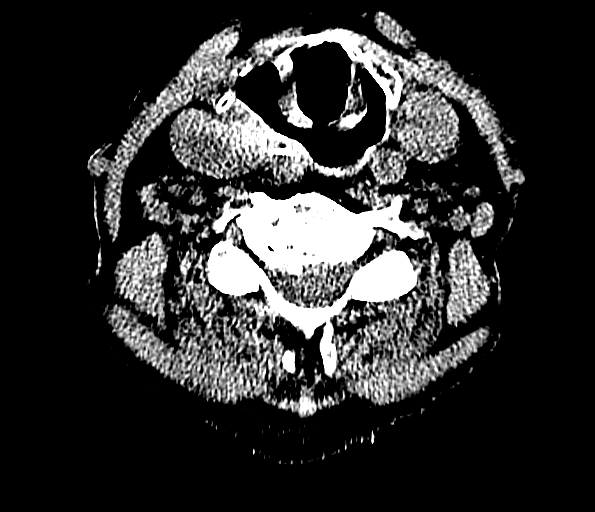
[im 32/82  bone]
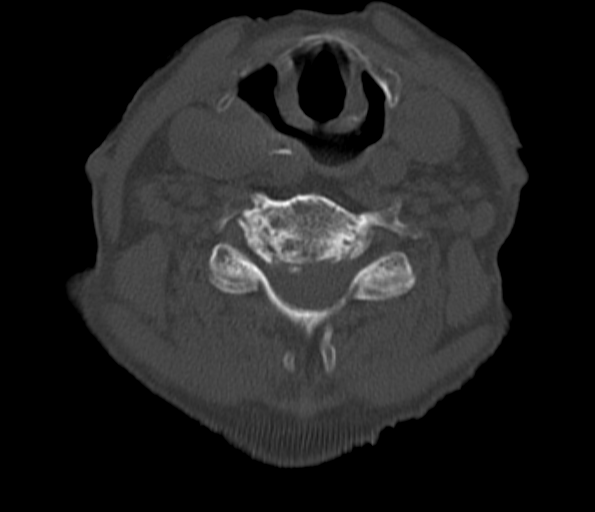
[im 38/82  brain]
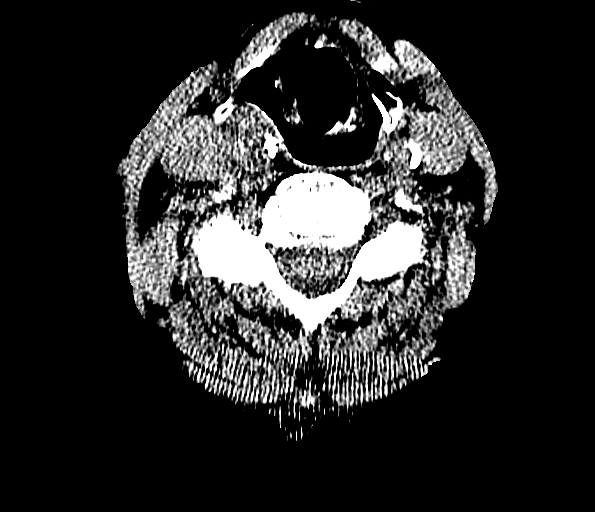
[im 44/82  brain]
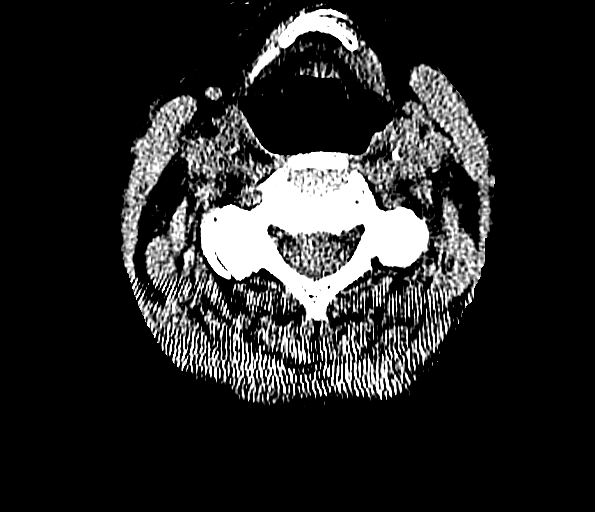
[im 50/82  brain]
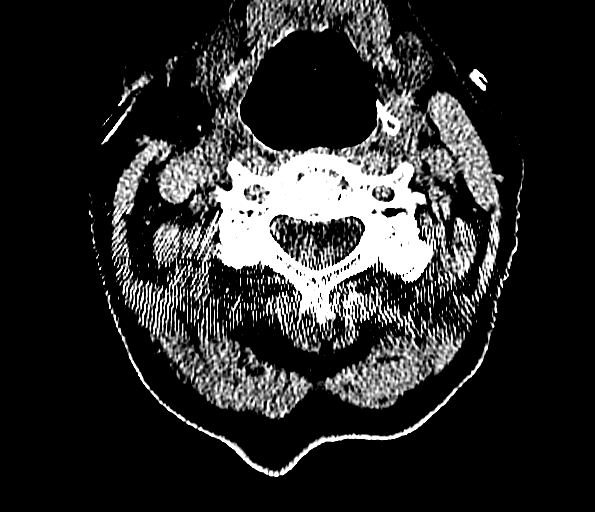
[im 57/82  brain]
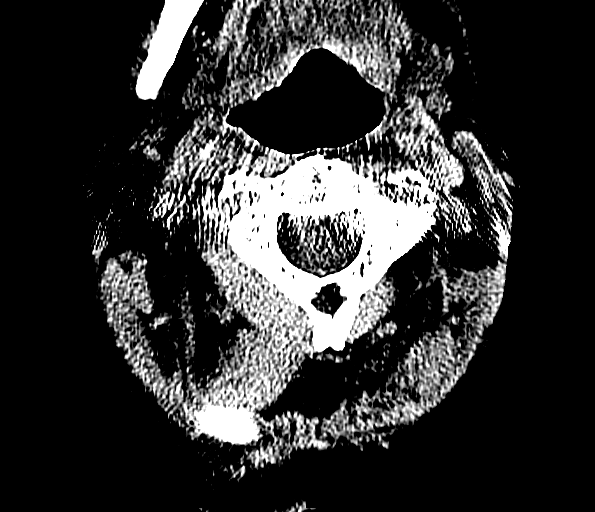
[im 57/82  bone]
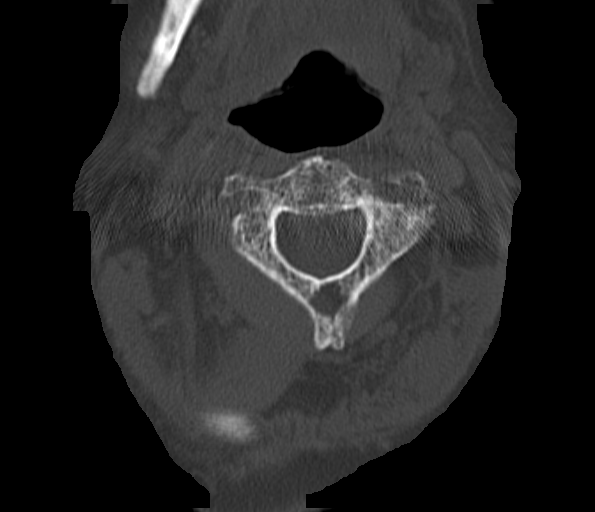
[im 63/82  brain]
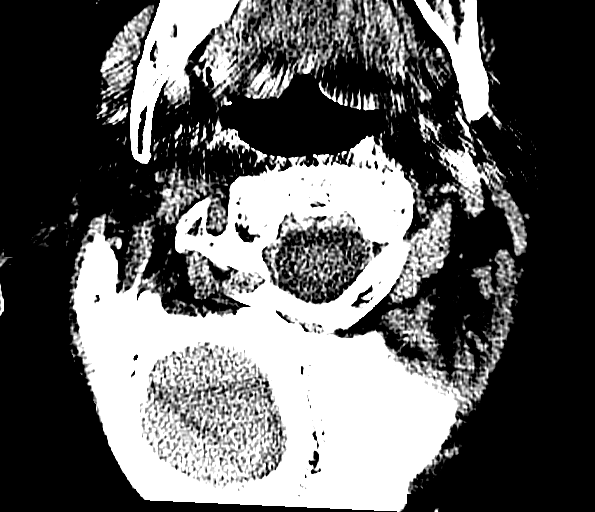
[im 69/82  brain]
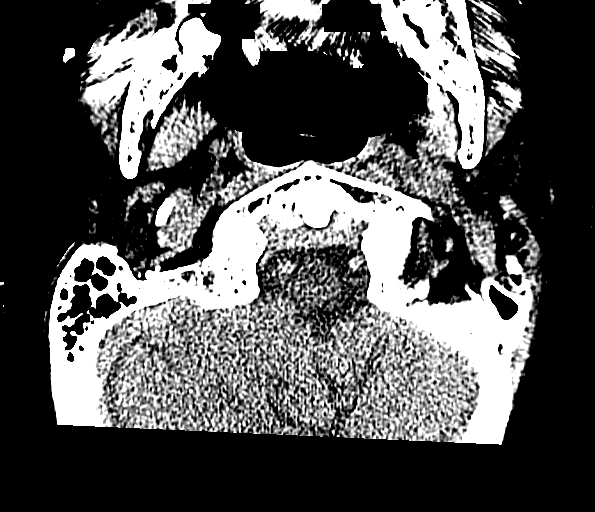
[im 75/82  brain]
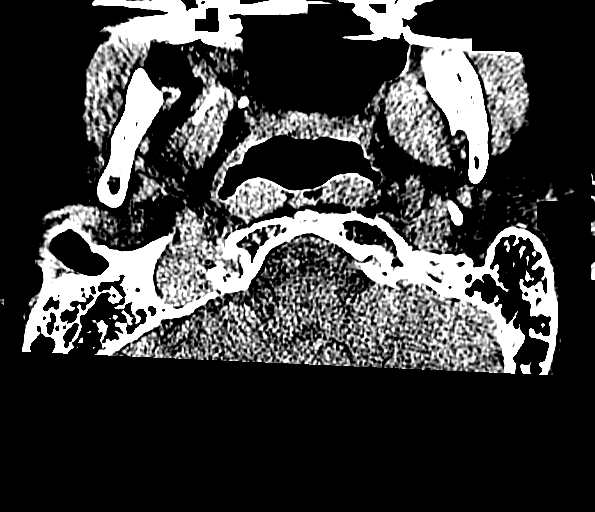

[12 of 30 positions shown; findings below may reference images not displayed]

FINDINGS: CT HEAD FINDINGS

There is moderate diffuse atrophy. There is no intracranial mass,
hemorrhage, extra-axial fluid collection, or midline shift. There is
small vessel disease throughout the centra semiovale bilaterally,
stable. There is no new gray-white compartment lesion. No acute
infarct apparent. Bony calvarium appears intact. Mastoids on the
left are clear. There is opacification of several inferior mastoids
on the right. Most of the mastoids on the right are clear. There is
a prominent concha bullosa on the left, an anatomic variant.

CT CERVICAL SPINE FINDINGS

There is no acute fracture or spondylolisthesis. There is evidence
of either old trauma involving the superior most aspect of the
odontoid or small os odontoidia, an anatomic variant. Prevertebral
soft tissues and predental space regions are normal.

There is moderately severe disc space narrowing at C5-6 and C6-7.
There is milder narrowing at C3-4 and C4-5. There is facet
hypertrophy to varying degrees at all levels bilaterally. There is
moderate exit foraminal narrowing at C3-4, C4-5, and C5-6
bilaterally due to bony hypertrophy. There is no frank disc
extrusion or stenosis.

There is bullous disease in the apices. There are foci of carotid
artery calcification bilaterally.
IMPRESSION: CT head: Atrophy with periventricular small vessel disease, stable.
No intracranial mass, hemorrhage, or extra-axial fluid collection.
No acute appearing infarct. Opacification of several inferior
mastoid air cells without air-fluid level. Mastoids elsewhere clear.

CT cervical spine: Multilevel arthropathy. No acute fracture or
spondylolisthesis. Question old trauma involving the superior most
aspect of the odontoid versus os odontoidia in this region. There
does not appear to be acute fracture in this area. There is bullous
disease in each lung apex. There are foci of carotid artery
calcification bilaterally.

## 2015-11-01 IMAGING — CR DG CHEST 2V
1 series · 2 of 2 positions shown · non-contrast
Comparison: July 28, 2014

CLINICAL DATA: Shortness of Breath

EXAM:
CHEST  2 VIEW

[Series 1: dg chest 2 view · 0.14mm/px · 2 of 2 slices shown]
[im 1/2]
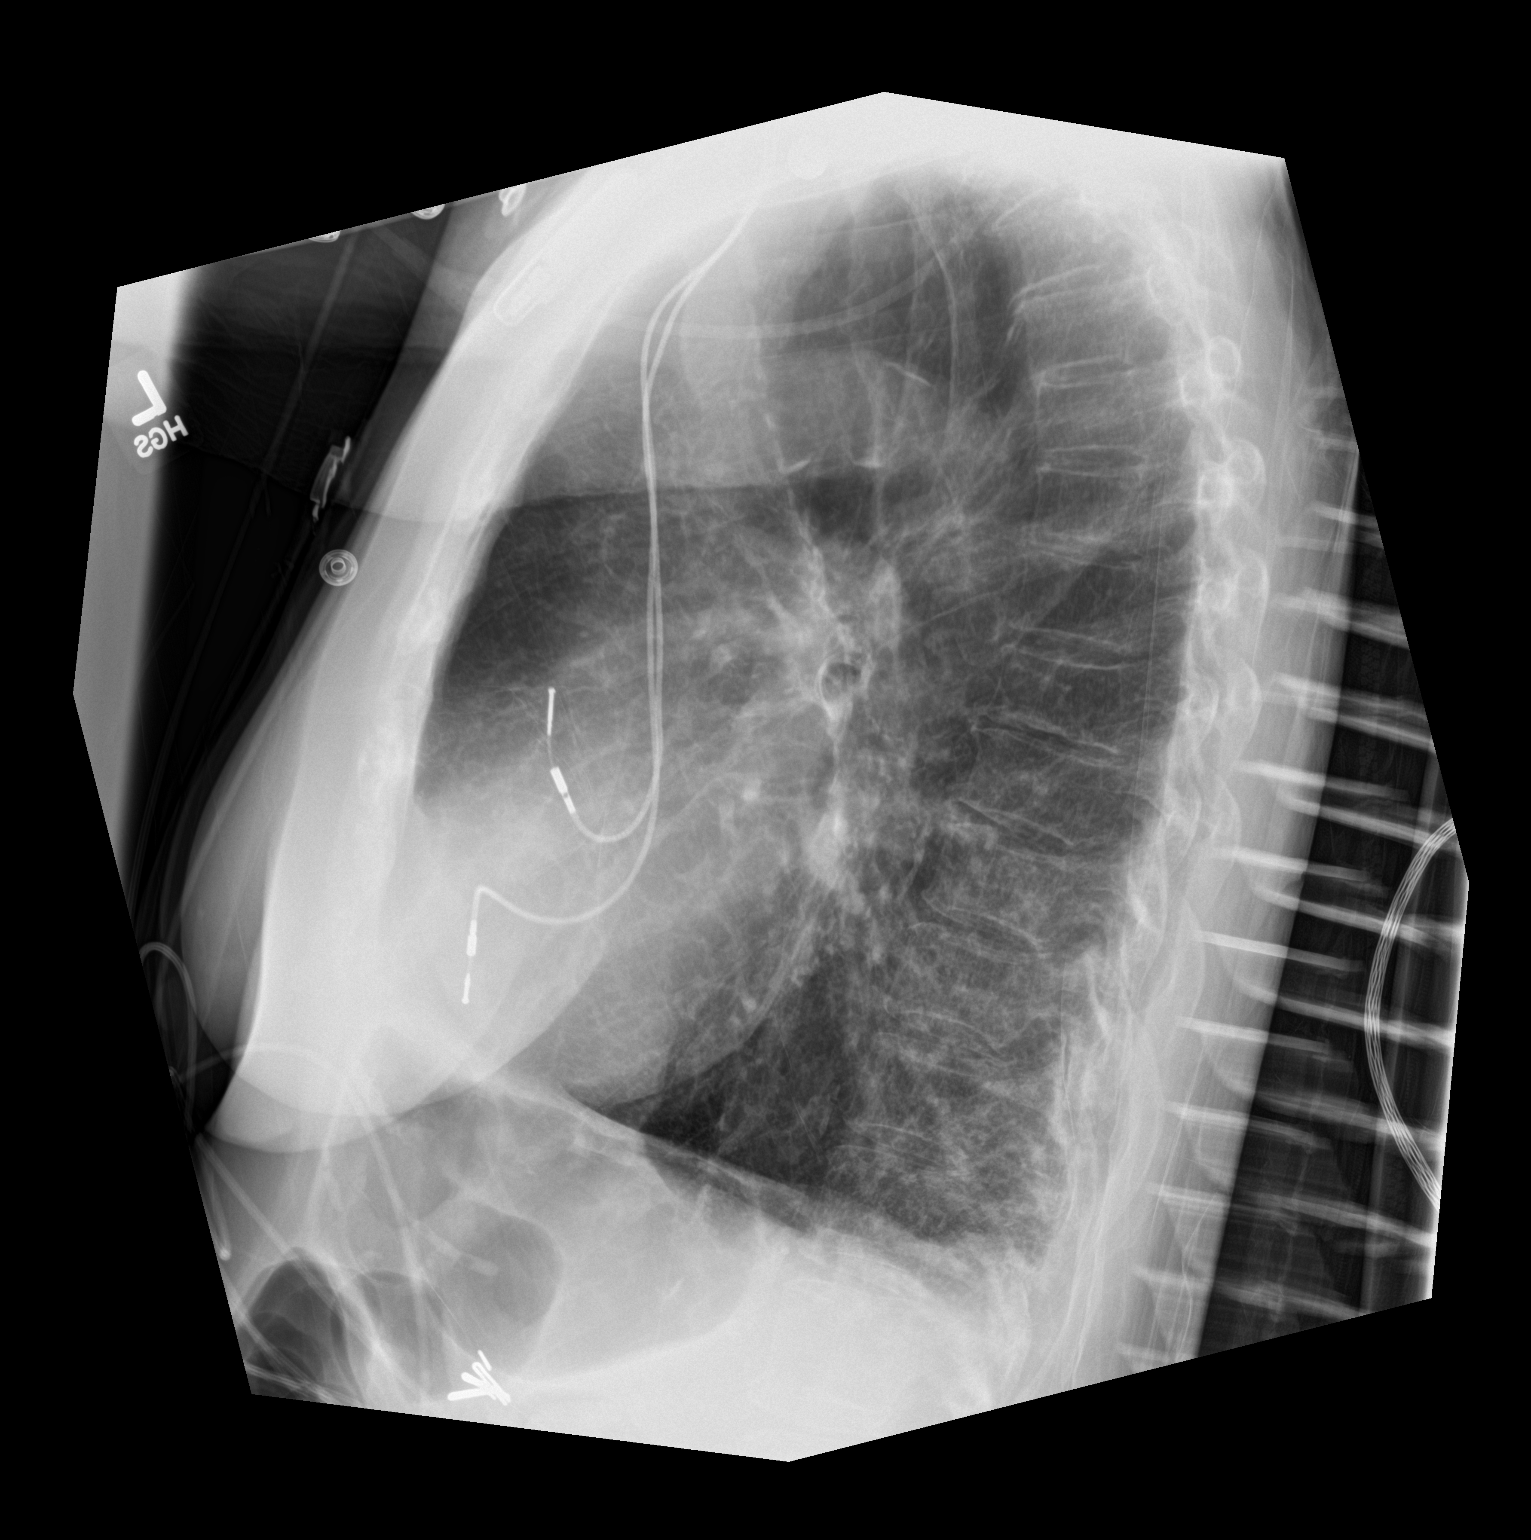
[im 2/2]
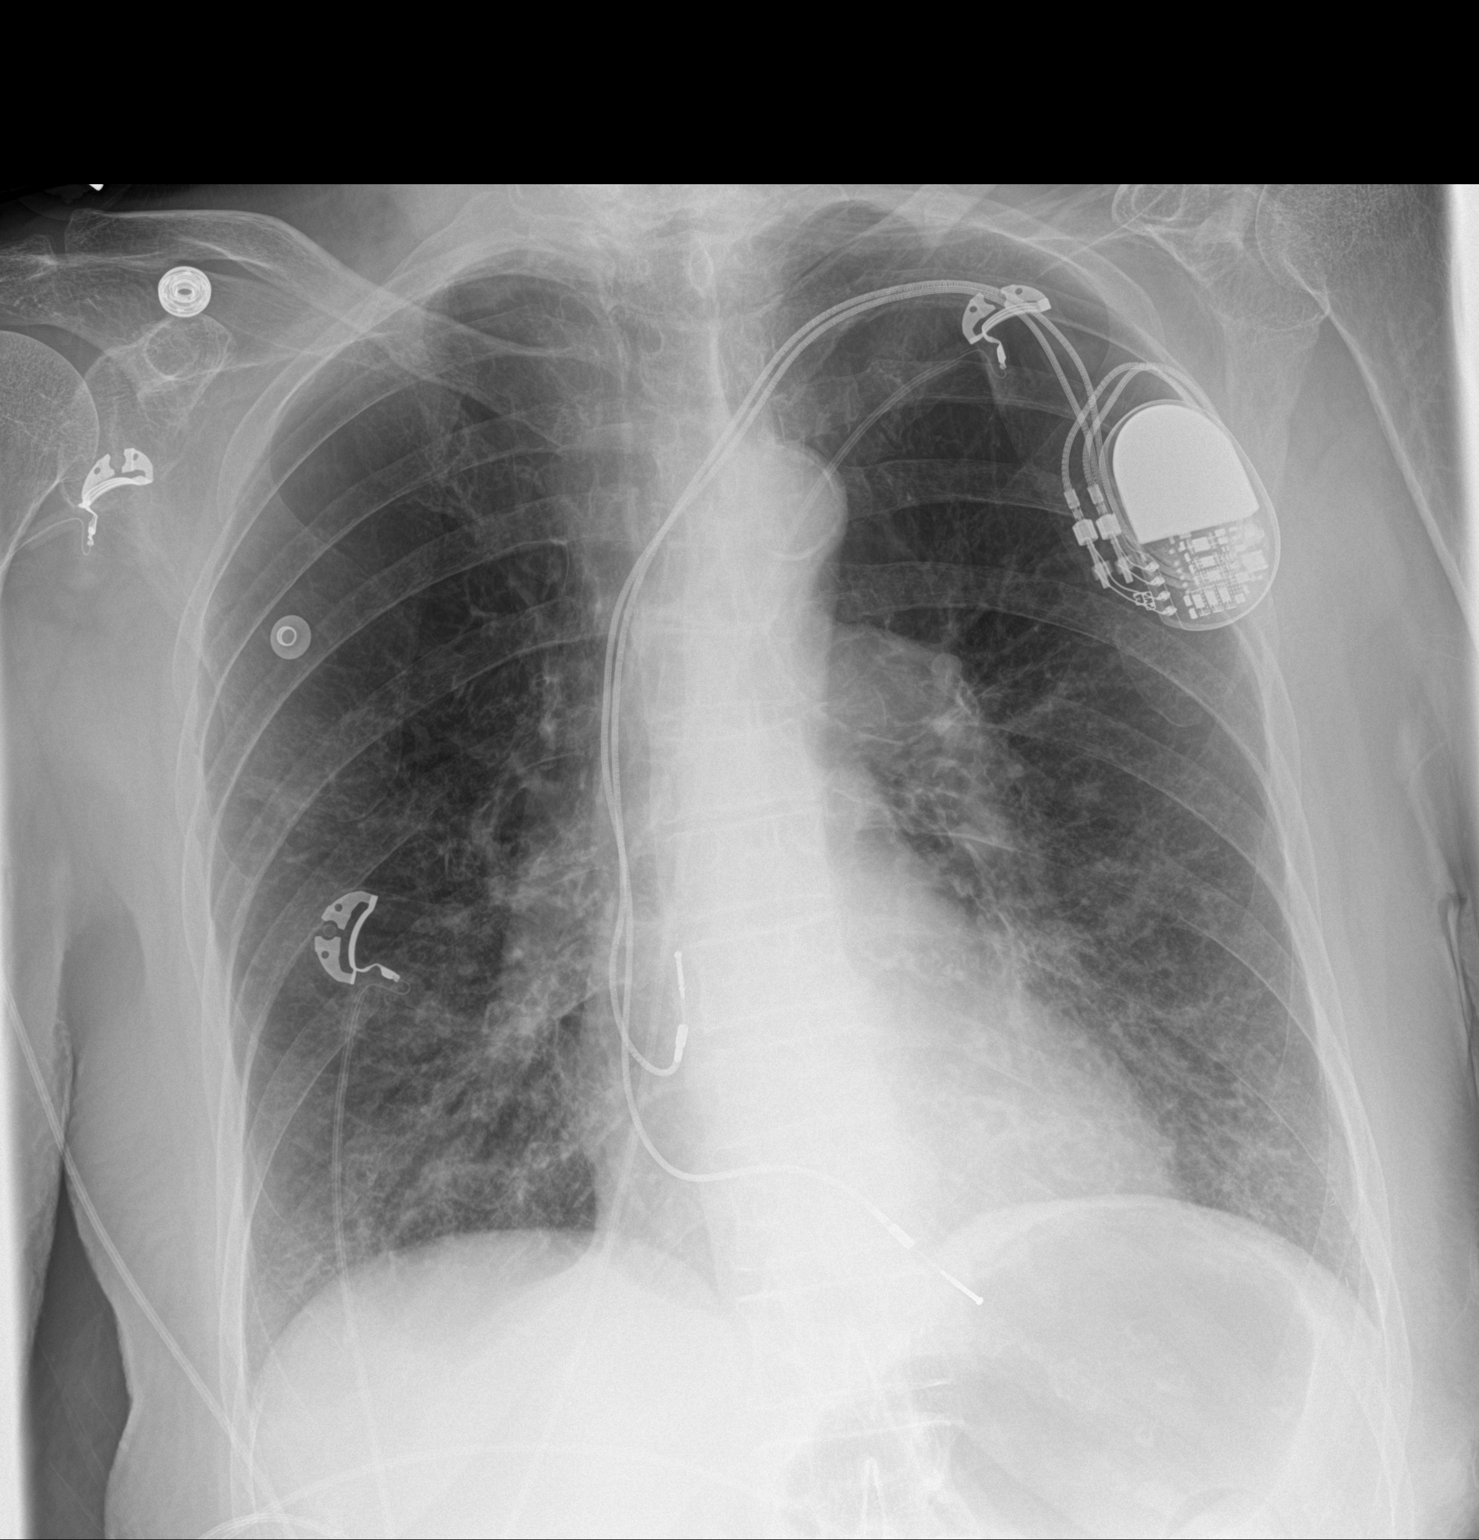

[2 of 2 positions shown; findings below may reference images not displayed]

FINDINGS: Underlying emphysematous changes again noted. Fibrotic type change
in the base regions remains. There is no frank edema or
consolidation. No new opacity. The heart size is normal. The
pulmonary vascular reflects underlying emphysematous change.
Pacemaker leads are attached to the right atrium and right
ventricle. Atherosclerotic changes again noted.
IMPRESSION: No change from 1 day prior. Underlying emphysematous change with
bibasilar fibrosis. No edema or consolidation.
